# Patient Record
Sex: Male | Born: 1940
Health system: Southern US, Community
[De-identification: ages and names within clinical notes are randomized; demographics above are authoritative.]

## PROBLEM LIST (undated history)

## (undated) DIAGNOSIS — I10 Essential (primary) hypertension: Secondary | ICD-10-CM

## (undated) DIAGNOSIS — K219 Gastro-esophageal reflux disease without esophagitis: Secondary | ICD-10-CM

## (undated) DIAGNOSIS — D649 Anemia, unspecified: Secondary | ICD-10-CM

## (undated) DIAGNOSIS — T4145XA Adverse effect of unspecified anesthetic, initial encounter: Secondary | ICD-10-CM

## (undated) DIAGNOSIS — M1990 Unspecified osteoarthritis, unspecified site: Secondary | ICD-10-CM

## (undated) DIAGNOSIS — N189 Chronic kidney disease, unspecified: Secondary | ICD-10-CM

## (undated) DIAGNOSIS — D631 Anemia in chronic kidney disease: Principal | ICD-10-CM

## (undated) DIAGNOSIS — T8859XA Other complications of anesthesia, initial encounter: Secondary | ICD-10-CM

## (undated) HISTORY — PX: CARPAL TUNNEL RELEASE: SHX101

## (undated) HISTORY — DX: Chronic kidney disease, unspecified: N18.9

## (undated) HISTORY — DX: Essential (primary) hypertension: I10

## (undated) HISTORY — PX: OTHER SURGICAL HISTORY: SHX169

## (undated) HISTORY — DX: Anemia in chronic kidney disease: D63.1

## (undated) HISTORY — DX: Gastro-esophageal reflux disease without esophagitis: K21.9

---

## 1970-08-22 HISTORY — PX: BACK SURGERY: SHX140

## 1998-12-15 ENCOUNTER — Ambulatory Visit (HOSPITAL_COMMUNITY): Admission: RE | Admit: 1998-12-15 | Discharge: 1998-12-15 | Payer: Self-pay | Admitting: Family Medicine

## 1998-12-15 ENCOUNTER — Encounter: Payer: Self-pay | Admitting: Family Medicine

## 2000-03-02 ENCOUNTER — Other Ambulatory Visit: Admission: RE | Admit: 2000-03-02 | Discharge: 2000-03-02 | Payer: Self-pay | Admitting: Urology

## 2001-08-22 HISTORY — PX: BACK SURGERY: SHX140

## 2003-10-13 ENCOUNTER — Inpatient Hospital Stay (HOSPITAL_COMMUNITY): Admission: RE | Admit: 2003-10-13 | Discharge: 2003-10-14 | Payer: Self-pay | Admitting: Orthopedic Surgery

## 2004-12-22 IMAGING — CR DG LUMBAR SPINE 1V
1 series · 1 of 1 positions shown · non-contrast
Comparison: none

CLINICAL DATA: Patient with lumbar spinal stenosis.
 LUMBAR SPINE
 Cross-table laterals from the OR interpreted after surgery.
 FILM #1:
 Film #1 demonstrates surgical needles directed towards the posterior aspect of the spinous processes of L3, L4, and L5.
 FILM #2:
 Film #2 demonstrates a surgical probe directed between the spinous processes of L3 and L4.
 IMPRESSION
 L3-4 localization.

[view not recorded]
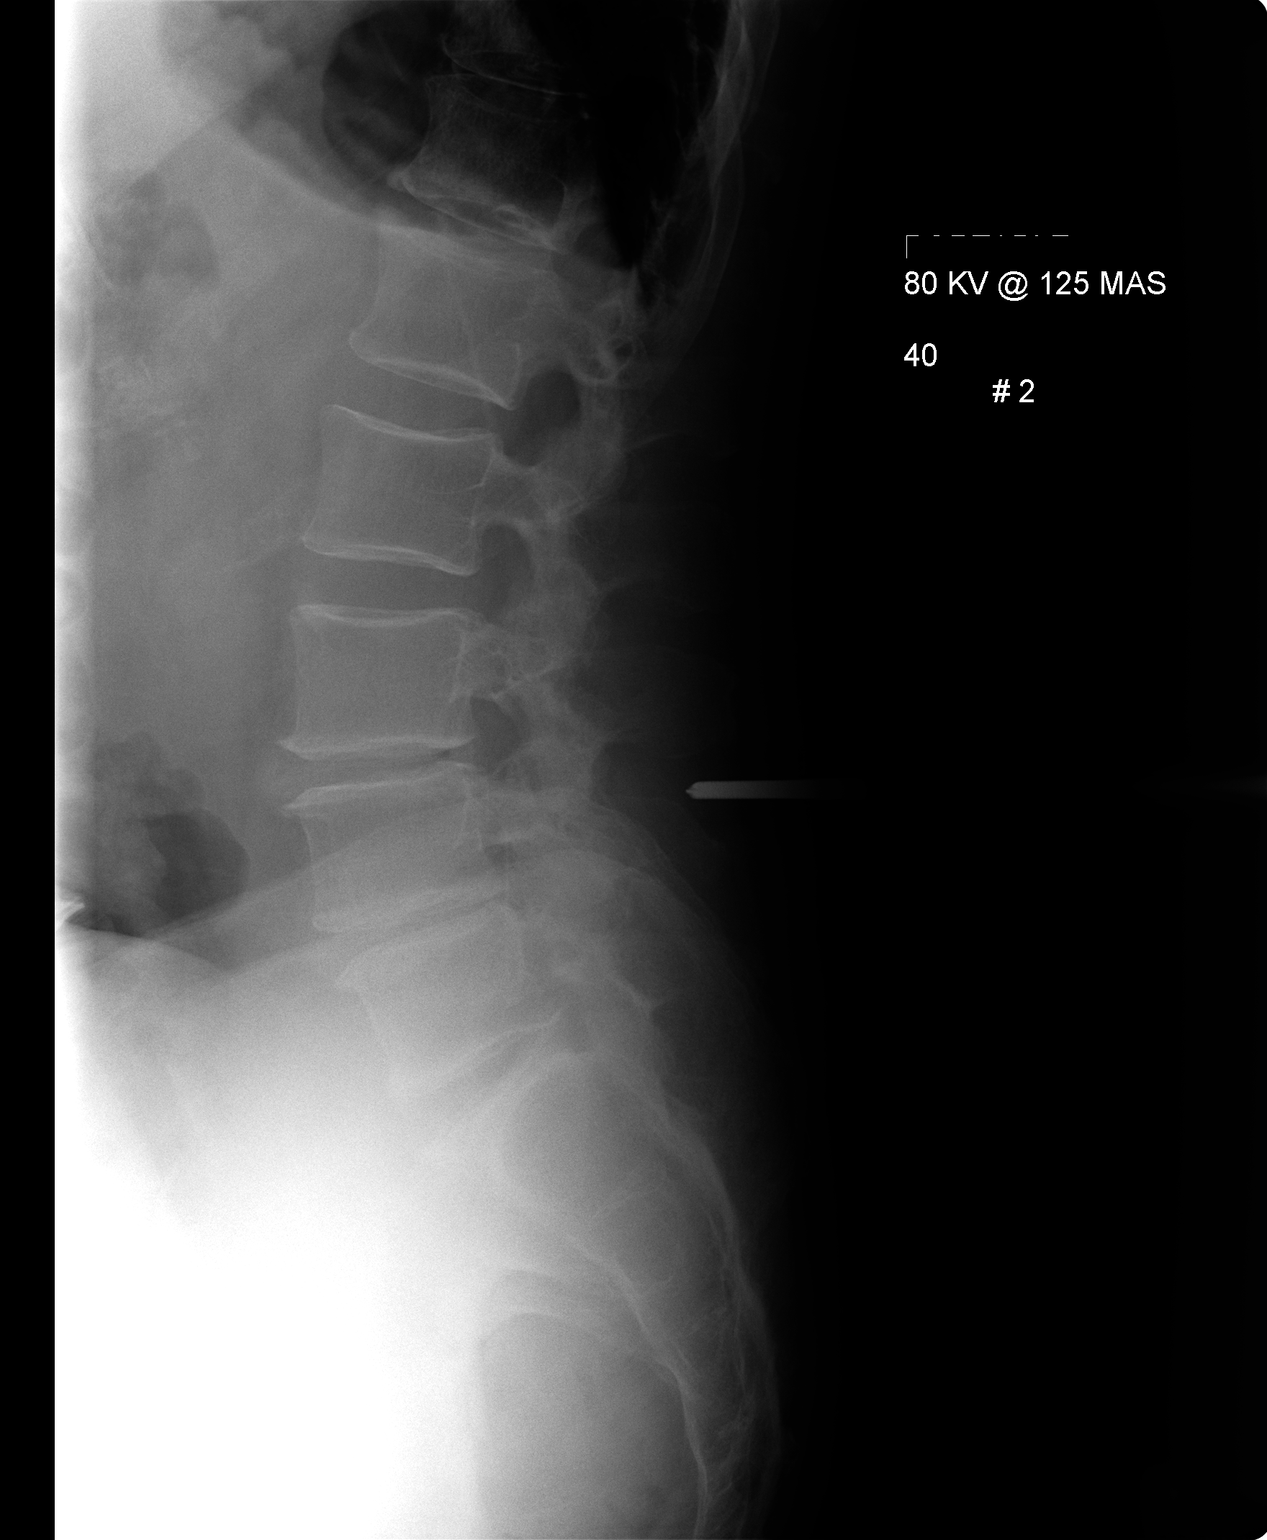

[1 of 1 positions shown; findings below may reference images not displayed]

## 2006-03-09 ENCOUNTER — Encounter: Admission: RE | Admit: 2006-03-09 | Discharge: 2006-03-09 | Payer: Self-pay | Admitting: Family Medicine

## 2006-06-16 ENCOUNTER — Ambulatory Visit (HOSPITAL_COMMUNITY): Admission: RE | Admit: 2006-06-16 | Discharge: 2006-06-16 | Payer: Self-pay | Admitting: Orthopedic Surgery

## 2006-07-11 ENCOUNTER — Ambulatory Visit (HOSPITAL_COMMUNITY): Admission: RE | Admit: 2006-07-11 | Discharge: 2006-07-11 | Payer: Self-pay | Admitting: Orthopedic Surgery

## 2009-12-02 ENCOUNTER — Encounter (INDEPENDENT_AMBULATORY_CARE_PROVIDER_SITE_OTHER): Payer: Self-pay | Admitting: *Deleted

## 2010-01-08 ENCOUNTER — Encounter (INDEPENDENT_AMBULATORY_CARE_PROVIDER_SITE_OTHER): Payer: Self-pay | Admitting: *Deleted

## 2010-09-21 NOTE — Letter (Signed)
Summary: Colonoscopy Letter  [facility]  [address] [place], [place] [address]   Phone: [phone]  Fax: [phone]      December 02, 2009 MRN: [id number]   Curtis Munoz [address] [place], [place]  [address]   Dear [name],   According to your medical record, it is time for you to schedule a Colonoscopy. The American Cancer Society recommends this procedure as a method to detect early colon cancer. Patients with a family history of colon cancer, or a personal history of colon polyps or inflammatory bowel disease are at increased risk.  This letter has beeen generated based on the recommendations made at the time of your procedure. If you feel that in your particular situation this may no longer apply, please contact our office.  Please call our office at [phone] to schedule this appointment or to update your records at your earliest convenience.  Thank you for cooperating with [place] to provide you with the very best care possible.   Sincerely,  Hedwig Morton. Juanda Chance, M.D.  [facility] Gastroenterology Division [phone]

## 2010-09-21 NOTE — Letter (Signed)
Summary: Colonoscopy Letter  [facility]  [address] [place], [place] [address]   Phone: [phone]  Fax: [phone]      Jan 08, 2010 MRN: [id number]   KAZIMIR HARTNETT [address] [place], [place]  [address]   Dear Mr. CREAR,   According to your medical record, it is time for you to schedule a Colonoscopy. The American Cancer Society recommends this procedure as a method to detect early colon cancer. Patients with a family history of colon cancer, or a personal history of colon polyps or inflammatory bowel disease are at increased risk.  This letter has beeen generated based on the recommendations made at the time of your procedure. If you feel that in your particular situation this may no longer apply, please contact our office.  Please call our office at [phone] to schedule this appointment or to update your records at your earliest convenience.  Thank you for cooperating with [place] to provide you with the very best care possible.   Sincerely,    Vania Rea. Jarold Motto, M.D.  [facility] Gastroenterology Division [phone]

## 2011-03-21 ENCOUNTER — Telehealth: Payer: Self-pay | Admitting: *Deleted

## 2011-03-21 NOTE — Telephone Encounter (Signed)
Pt states that he does not wish to schedule his colonoscopy until he meets with his Primary care MD. He will call back to schedule after his visit with her.

## 2011-11-11 DIAGNOSIS — E781 Pure hyperglyceridemia: Secondary | ICD-10-CM | POA: Diagnosis not present

## 2011-11-29 ENCOUNTER — Ambulatory Visit
Admission: RE | Admit: 2011-11-29 | Discharge: 2011-11-29 | Disposition: A | Discharge status: Home / Self Care | Payer: Medicare Other | Source: Ambulatory Visit | Attending: Family Medicine | Admitting: Family Medicine

## 2011-11-29 ENCOUNTER — Other Ambulatory Visit: Payer: Self-pay | Admitting: Family Medicine

## 2011-11-29 DIAGNOSIS — H60399 Other infective otitis externa, unspecified ear: Secondary | ICD-10-CM | POA: Diagnosis not present

## 2011-11-29 DIAGNOSIS — M779 Enthesopathy, unspecified: Secondary | ICD-10-CM

## 2011-11-29 DIAGNOSIS — M79609 Pain in unspecified limb: Secondary | ICD-10-CM | POA: Diagnosis not present

## 2011-11-29 DIAGNOSIS — M7730 Calcaneal spur, unspecified foot: Secondary | ICD-10-CM | POA: Diagnosis not present

## 2012-02-17 DIAGNOSIS — I10 Essential (primary) hypertension: Secondary | ICD-10-CM | POA: Diagnosis not present

## 2012-02-17 DIAGNOSIS — E109 Type 1 diabetes mellitus without complications: Secondary | ICD-10-CM | POA: Diagnosis not present

## 2012-02-17 DIAGNOSIS — E781 Pure hyperglyceridemia: Secondary | ICD-10-CM | POA: Diagnosis not present

## 2012-02-24 DIAGNOSIS — H60399 Other infective otitis externa, unspecified ear: Secondary | ICD-10-CM | POA: Diagnosis not present

## 2012-02-24 DIAGNOSIS — E1110 Type 2 diabetes mellitus with ketoacidosis without coma: Secondary | ICD-10-CM | POA: Diagnosis not present

## 2012-02-24 DIAGNOSIS — E7800 Pure hypercholesterolemia, unspecified: Secondary | ICD-10-CM | POA: Diagnosis not present

## 2012-02-24 DIAGNOSIS — I10 Essential (primary) hypertension: Secondary | ICD-10-CM | POA: Diagnosis not present

## 2012-03-19 DIAGNOSIS — H60399 Other infective otitis externa, unspecified ear: Secondary | ICD-10-CM | POA: Diagnosis not present

## 2012-07-24 DIAGNOSIS — E781 Pure hyperglyceridemia: Secondary | ICD-10-CM | POA: Diagnosis not present

## 2012-07-24 DIAGNOSIS — E7800 Pure hypercholesterolemia, unspecified: Secondary | ICD-10-CM | POA: Diagnosis not present

## 2012-07-24 DIAGNOSIS — Z23 Encounter for immunization: Secondary | ICD-10-CM | POA: Diagnosis not present

## 2012-07-24 DIAGNOSIS — E119 Type 2 diabetes mellitus without complications: Secondary | ICD-10-CM | POA: Diagnosis not present

## 2012-07-24 DIAGNOSIS — Z125 Encounter for screening for malignant neoplasm of prostate: Secondary | ICD-10-CM | POA: Diagnosis not present

## 2012-07-24 DIAGNOSIS — I10 Essential (primary) hypertension: Secondary | ICD-10-CM | POA: Diagnosis not present

## 2012-12-25 DIAGNOSIS — I10 Essential (primary) hypertension: Secondary | ICD-10-CM | POA: Diagnosis not present

## 2012-12-25 DIAGNOSIS — E7800 Pure hypercholesterolemia, unspecified: Secondary | ICD-10-CM | POA: Diagnosis not present

## 2012-12-25 DIAGNOSIS — E119 Type 2 diabetes mellitus without complications: Secondary | ICD-10-CM | POA: Diagnosis not present

## 2013-01-16 DIAGNOSIS — H60399 Other infective otitis externa, unspecified ear: Secondary | ICD-10-CM | POA: Diagnosis not present

## 2013-05-24 ENCOUNTER — Encounter: Payer: Self-pay | Admitting: Gastroenterology

## 2013-05-24 DIAGNOSIS — I10 Essential (primary) hypertension: Secondary | ICD-10-CM | POA: Diagnosis not present

## 2013-05-24 DIAGNOSIS — Z23 Encounter for immunization: Secondary | ICD-10-CM | POA: Diagnosis not present

## 2013-05-24 DIAGNOSIS — D649 Anemia, unspecified: Secondary | ICD-10-CM | POA: Diagnosis not present

## 2013-05-24 DIAGNOSIS — E119 Type 2 diabetes mellitus without complications: Secondary | ICD-10-CM | POA: Diagnosis not present

## 2013-05-24 DIAGNOSIS — E7800 Pure hypercholesterolemia, unspecified: Secondary | ICD-10-CM | POA: Diagnosis not present

## 2013-06-03 ENCOUNTER — Ambulatory Visit (AMBULATORY_SURGERY_CENTER): Payer: Self-pay

## 2013-06-03 VITALS — Ht 70.0 in | Wt 194.0 lb

## 2013-06-03 DIAGNOSIS — Z1211 Encounter for screening for malignant neoplasm of colon: Secondary | ICD-10-CM

## 2013-06-03 MED ORDER — MOVIPREP 100 G PO SOLR: 1.0000 | Freq: Once | ORAL | Status: DC

## 2013-06-13 DIAGNOSIS — H2589 Other age-related cataract: Secondary | ICD-10-CM | POA: Diagnosis not present

## 2013-06-17 ENCOUNTER — Encounter: Payer: Self-pay | Admitting: Gastroenterology

## 2013-06-17 ENCOUNTER — Ambulatory Visit (AMBULATORY_SURGERY_CENTER): Payer: Medicare Other | Admitting: Gastroenterology

## 2013-06-17 VITALS — BP 109/76 | HR 55 | Temp 96.8°F | Resp 13 | Ht 70.0 in | Wt 194.0 lb

## 2013-06-17 DIAGNOSIS — Z1211 Encounter for screening for malignant neoplasm of colon: Secondary | ICD-10-CM | POA: Diagnosis not present

## 2013-06-17 DIAGNOSIS — I10 Essential (primary) hypertension: Secondary | ICD-10-CM | POA: Diagnosis not present

## 2013-06-17 DIAGNOSIS — K5730 Diverticulosis of large intestine without perforation or abscess without bleeding: Secondary | ICD-10-CM

## 2013-06-17 MED ORDER — SODIUM CHLORIDE 0.9 % IV SOLN: 500.0000 mL | INTRAVENOUS | Status: DC

## 2013-06-17 NOTE — Progress Notes (Signed)
Patient did not experience any of the following events: a burn prior to discharge; a fall within the facility; wrong site/side/patient/procedure/implant event; or a hospital transfer or hospital admission upon discharge from the facility. (G8907) Patient did not have preoperative order for IV antibiotic SSI prophylaxis. (G8918)  

## 2013-06-17 NOTE — Patient Instructions (Signed)
Impressions/recommendations:  Diverticulosis (handout given) High Fiber diet (handout given)  Repeat colonoscopy in 10 years.  YOU HAD AN ENDOSCOPIC PROCEDURE TODAY AT THE [facility]: Refer to the procedure report that was given to you for any specific questions about what was found during the examination.  If the procedure report does not answer your questions, please call your gastroenterologist to clarify.  If you requested that your care partner not be given the details of your procedure findings, then the procedure report has been included in a sealed envelope for you to review at your convenience later.  YOU SHOULD EXPECT: Some feelings of bloating in the abdomen. Passage of more gas than usual.  Walking can help get rid of the air that was put into your GI tract during the procedure and reduce the bloating. If you had a lower endoscopy (such as a colonoscopy or flexible sigmoidoscopy) you may notice spotting of blood in your stool or on the toilet paper. If you underwent a bowel prep for your procedure, then you may not have a normal bowel movement for a few days.  DIET: Your first meal following the procedure should be a light meal and then it is ok to progress to your normal diet.  A half-sandwich or bowl of soup is an example of a good first meal.  Heavy or fried foods are harder to digest and may make you feel nauseous or bloated.  Likewise meals heavy in dairy and vegetables can cause extra gas to form and this can also increase the bloating.  Drink plenty of fluids but you should avoid alcoholic beverages for 24 hours.  ACTIVITY: Your care partner should take you home directly after the procedure.  You should plan to take it easy, moving slowly for the rest of the day.  You can resume normal activity the day after the procedure however you should NOT DRIVE or use heavy machinery for 24 hours (because of the sedation medicines used during the test).    SYMPTOMS TO REPORT  IMMEDIATELY: A gastroenterologist can be reached at any hour.  During normal business hours, 8:30 AM to 5:00 PM Monday through Friday, call [phone].  After hours and on weekends, please call the GI answering service at [phone] who will take a message and have the physician on call contact you.   Following lower endoscopy (colonoscopy or flexible sigmoidoscopy):  Excessive amounts of blood in the stool  Significant tenderness or worsening of abdominal pains  Swelling of the abdomen that is new, acute  Fever of 100F or higher  FOLLOW UP: If any biopsies were taken you will be contacted by phone or by letter within the next 1-3 weeks.  Call your gastroenterologist if you have not heard about the biopsies in 3 weeks.  Our staff will call the home number listed on your records the next business day following your procedure to check on you and address any questions or concerns that you may have at that time regarding the information given to you following your procedure. This is a courtesy call and so if there is no answer at the home number and we have not heard from you through the emergency physician on call, we will assume that you have returned to your regular daily activities without incident.  SIGNATURES/CONFIDENTIALITY: You and/or your care partner have signed paperwork which will be entered into your electronic medical record.  These signatures attest to the fact that that the information above on your   After Visit Summary has been reviewed and is understood.  Full responsibility of the confidentiality of this discharge information lies with you and/or your care-partner. 

## 2013-06-17 NOTE — Op Note (Signed)
[facility] [address] [place], [address]   COLONOSCOPY PROCEDURE REPORT  PATIENT: Curtis Munoz, Curtis Munoz  MR#: [id number] BIRTHDATE: Oct 24, 1940 , 72  yrs. old GENDER: Male ENDOSCOPIST: Mardella Layman, MD, [facility] REFERRED BY: PROCEDURE DATE:  06/17/2013 PROCEDURE:   Colonoscopy, screening First Screening Colonoscopy - Avg.  risk and is 50 yrs.  old or older - No.      History of Adenoma - Now for follow-up colonoscopy & has been > or = to 3 yrs.  N/A ASA CLASS:   Class II INDICATIONS:average risk screening. MEDICATIONS: propofol (Diprivan) 250mg  IV  DESCRIPTION OF PROCEDURE:   After the risks benefits and alternatives of the procedure were thoroughly explained, informed consent was obtained.  A digital rectal exam revealed no abnormalities of the rectum.   The LB [id number]  endoscope was introduced through the anus and advanced to the   . No adverse events experienced.   The quality of the prep was excellent, using MoviPrep  The instrument was then slowly withdrawn as the colon was fully examined.      COLON FINDINGS: There was moderate diverticulosis noted in the descending colon, sigmoid colon, and at the splenic flexure with associated colonic narrowing, muscular hypertrophy and colonic spasm.   The colon was otherwise normal.  There was no diverticulosis, inflammation, polyps or cancers unless previously stated.  Retroflexed views revealed no abnormalities. The time to cecum=10 minutes 43 seconds.  Withdrawal time=6 minutes 14 seconds. The scope was withdrawn and the procedure completed. COMPLICATIONS: There were no complications.  ENDOSCOPIC IMPRESSION: 1.   There was moderate diverticulosis noted in the descending colon, sigmoid colon, and at the splenic flexure 2.   The colon was otherwise normal  RECOMMENDATIONS: 1.  High fiber diet with liberal fluid intake. 2.  Continue current colorectal screening recommendations for "routine risk"  patients with a repeat colonoscopy in 10 years. 3.  Continue current medications   eSigned:  Mardella Layman, MD, [facility] 06/17/2013 9:11 AM   cc:

## 2013-06-18 ENCOUNTER — Telehealth: Payer: Self-pay | Admitting: *Deleted

## 2013-06-18 NOTE — Telephone Encounter (Signed)
  Follow up Call-  Call back number 06/17/2013  Post procedure Call Back phone  # [phone] hm  Permission to leave phone message Yes     Patient questions:  Do you have a fever, pain , or abdominal swelling? no Pain Score  0 *  Have you tolerated food without any problems? yes  Have you been able to return to your normal activities? yes  Do you have any questions about your discharge instructions: Diet   no Medications  no Follow up visit  no  Do you have questions or concerns about your Care? no  Actions: * If pain score is 4 or above: No action needed, pain <4.

## 2013-08-23 ENCOUNTER — Ambulatory Visit
Admission: RE | Admit: 2013-08-23 | Discharge: 2013-08-23 | Disposition: A | Discharge status: Home / Self Care | Payer: Medicare Other | Source: Ambulatory Visit | Attending: Family Medicine | Admitting: Family Medicine

## 2013-08-23 ENCOUNTER — Other Ambulatory Visit: Payer: Self-pay | Admitting: Family Medicine

## 2013-08-23 DIAGNOSIS — M7732 Calcaneal spur, left foot: Secondary | ICD-10-CM

## 2013-08-23 DIAGNOSIS — M7730 Calcaneal spur, unspecified foot: Secondary | ICD-10-CM | POA: Diagnosis not present

## 2013-08-23 DIAGNOSIS — I10 Essential (primary) hypertension: Secondary | ICD-10-CM | POA: Diagnosis not present

## 2013-08-23 DIAGNOSIS — E119 Type 2 diabetes mellitus without complications: Secondary | ICD-10-CM | POA: Diagnosis not present

## 2013-08-23 DIAGNOSIS — E7800 Pure hypercholesterolemia, unspecified: Secondary | ICD-10-CM | POA: Diagnosis not present

## 2013-09-11 ENCOUNTER — Encounter: Payer: Self-pay | Admitting: Podiatry

## 2013-09-11 ENCOUNTER — Ambulatory Visit (INDEPENDENT_AMBULATORY_CARE_PROVIDER_SITE_OTHER): Payer: Medicare Other | Admitting: Podiatry

## 2013-09-11 VITALS — BP 130/62 | HR 62 | Resp 16 | Ht 70.0 in | Wt 194.0 lb

## 2013-09-11 DIAGNOSIS — M722 Plantar fascial fibromatosis: Secondary | ICD-10-CM

## 2013-09-11 MED ORDER — TRIAMCINOLONE ACETONIDE 10 MG/ML IJ SUSP
10.0000 mg | Freq: Once | INTRAMUSCULAR | Status: AC
  Administered 2013-09-11: 10 mg

## 2013-09-11 NOTE — Patient Instructions (Signed)

## 2013-09-11 NOTE — Progress Notes (Signed)
Subjective:     Patient ID: Curtis Munoz, male   DOB: [birth year], 73 y.o.   MRN: [id number]  Foot Pain   patient states that he is heel pain on his left heel of several months duration and it has been gradually getting worse over that period of time. Has tried to reduce activities and wear over-the-counter arch supports without relief at this time   Review of Systems  All other systems reviewed and are negative.       Objective:   Physical Exam  Nursing note and vitals reviewed. Cardiovascular: Intact distal pulses.   Musculoskeletal: Normal range of motion.  Neurological: He is alert.  Skin: Skin is warm.   neurovascular status intact with no health history changes noted and patient has mild equinus condition with normal muscle strength noted normal hair growth and temperature turgor texture. Patient's left plantar heel is very tender at the insertional point of the fascia to the calcaneus     Assessment:     Plantar fasciitis left inflammation and fluid around the medial band    Plan:     H&P and x-ray reviewed. Injected the left plantar fascia 3 mg Kenalog 5 mg Xylocaine Marcaine mixture a. Reappoint to recheck in one weeknd applied fascially brace to reduce pressure

## 2013-09-11 NOTE — Progress Notes (Signed)
   Subjective:    Patient ID: Curtis Munoz, male    DOB: 04-13-1941, 73 y.o.   MRN: [id number]  HPI Comments: "I have a heel that is hurting"  Patient states that his plantar heel left foot has been achy for a few months. He has AM pain. His PCP xrayed and said that he did have heel spurs. No treatment given. Pt usually walks with a walking group 2 x wkly x 1hr and he has decreased that with no relief.   Foot Pain      Review of Systems  Musculoskeletal: Positive for gait problem.  All other systems reviewed and are negative.       Objective:   Physical Exam        Assessment & Plan:

## 2013-09-16 ENCOUNTER — Encounter: Payer: Self-pay | Admitting: Podiatry

## 2013-09-16 ENCOUNTER — Ambulatory Visit (INDEPENDENT_AMBULATORY_CARE_PROVIDER_SITE_OTHER): Payer: Medicare Other | Admitting: Podiatry

## 2013-09-16 VITALS — BP 109/51 | HR 70 | Resp 16

## 2013-09-16 DIAGNOSIS — M722 Plantar fascial fibromatosis: Secondary | ICD-10-CM | POA: Diagnosis not present

## 2013-09-16 NOTE — Progress Notes (Signed)
Subjective:     Patient ID: Curtis Munoz, male   DOB: 04-26-1941, 73 y.o.   MRN: [id number]  HPI patient states I'm doing a lot better with my left heel and I'm able to walk without discomfort   Review of Systems     Objective:   Physical Exam Neurovascular status intact with no health history changes noted and significant reduction of discomfort left plantar heel at the insertion of the tendon into the calcaneus    Assessment:     Improved plantar fasciitis left with treatment    Plan:     Advised on physical therapy anti-inflammatories and supportive shoe gear reappoint as needed

## 2013-12-24 DIAGNOSIS — E7800 Pure hypercholesterolemia, unspecified: Secondary | ICD-10-CM | POA: Diagnosis not present

## 2013-12-24 DIAGNOSIS — Z125 Encounter for screening for malignant neoplasm of prostate: Secondary | ICD-10-CM | POA: Diagnosis not present

## 2013-12-24 DIAGNOSIS — I10 Essential (primary) hypertension: Secondary | ICD-10-CM | POA: Diagnosis not present

## 2013-12-24 DIAGNOSIS — E119 Type 2 diabetes mellitus without complications: Secondary | ICD-10-CM | POA: Diagnosis not present

## 2014-05-06 DIAGNOSIS — E119 Type 2 diabetes mellitus without complications: Secondary | ICD-10-CM | POA: Diagnosis not present

## 2014-05-06 DIAGNOSIS — R7309 Other abnormal glucose: Secondary | ICD-10-CM | POA: Diagnosis not present

## 2014-05-06 DIAGNOSIS — E669 Obesity, unspecified: Secondary | ICD-10-CM | POA: Diagnosis not present

## 2014-05-06 DIAGNOSIS — I10 Essential (primary) hypertension: Secondary | ICD-10-CM | POA: Diagnosis not present

## 2014-07-31 DIAGNOSIS — E1169 Type 2 diabetes mellitus with other specified complication: Secondary | ICD-10-CM | POA: Diagnosis not present

## 2014-07-31 DIAGNOSIS — E785 Hyperlipidemia, unspecified: Secondary | ICD-10-CM | POA: Diagnosis not present

## 2014-09-03 DIAGNOSIS — I10 Essential (primary) hypertension: Secondary | ICD-10-CM | POA: Diagnosis not present

## 2014-09-03 DIAGNOSIS — E782 Mixed hyperlipidemia: Secondary | ICD-10-CM | POA: Diagnosis not present

## 2014-09-03 DIAGNOSIS — Z23 Encounter for immunization: Secondary | ICD-10-CM | POA: Diagnosis not present

## 2015-01-01 DIAGNOSIS — E785 Hyperlipidemia, unspecified: Secondary | ICD-10-CM | POA: Diagnosis not present

## 2015-01-01 DIAGNOSIS — R7309 Other abnormal glucose: Secondary | ICD-10-CM | POA: Diagnosis not present

## 2015-01-09 DIAGNOSIS — L989 Disorder of the skin and subcutaneous tissue, unspecified: Secondary | ICD-10-CM | POA: Diagnosis not present

## 2015-01-09 DIAGNOSIS — E119 Type 2 diabetes mellitus without complications: Secondary | ICD-10-CM | POA: Diagnosis not present

## 2015-04-01 DIAGNOSIS — L821 Other seborrheic keratosis: Secondary | ICD-10-CM | POA: Diagnosis not present

## 2015-04-01 DIAGNOSIS — D485 Neoplasm of uncertain behavior of skin: Secondary | ICD-10-CM | POA: Diagnosis not present

## 2015-04-01 DIAGNOSIS — C44310 Basal cell carcinoma of skin of unspecified parts of face: Secondary | ICD-10-CM | POA: Diagnosis not present

## 2015-04-01 DIAGNOSIS — D1801 Hemangioma of skin and subcutaneous tissue: Secondary | ICD-10-CM | POA: Diagnosis not present

## 2015-04-01 DIAGNOSIS — C44319 Basal cell carcinoma of skin of other parts of face: Secondary | ICD-10-CM | POA: Diagnosis not present

## 2015-04-01 DIAGNOSIS — D225 Melanocytic nevi of trunk: Secondary | ICD-10-CM | POA: Diagnosis not present

## 2015-04-01 DIAGNOSIS — D224 Melanocytic nevi of scalp and neck: Secondary | ICD-10-CM | POA: Diagnosis not present

## 2015-04-14 DIAGNOSIS — D235 Other benign neoplasm of skin of trunk: Secondary | ICD-10-CM | POA: Diagnosis not present

## 2015-04-14 DIAGNOSIS — D485 Neoplasm of uncertain behavior of skin: Secondary | ICD-10-CM | POA: Diagnosis not present

## 2015-05-06 DIAGNOSIS — C44319 Basal cell carcinoma of skin of other parts of face: Secondary | ICD-10-CM | POA: Diagnosis not present

## 2015-05-06 DIAGNOSIS — Z85828 Personal history of other malignant neoplasm of skin: Secondary | ICD-10-CM | POA: Diagnosis not present

## 2015-05-12 DIAGNOSIS — I10 Essential (primary) hypertension: Secondary | ICD-10-CM | POA: Diagnosis not present

## 2015-05-12 DIAGNOSIS — L989 Disorder of the skin and subcutaneous tissue, unspecified: Secondary | ICD-10-CM | POA: Diagnosis not present

## 2015-05-12 DIAGNOSIS — E119 Type 2 diabetes mellitus without complications: Secondary | ICD-10-CM | POA: Diagnosis not present

## 2015-05-19 DIAGNOSIS — Z85828 Personal history of other malignant neoplasm of skin: Secondary | ICD-10-CM | POA: Diagnosis not present

## 2015-05-19 DIAGNOSIS — C44319 Basal cell carcinoma of skin of other parts of face: Secondary | ICD-10-CM | POA: Diagnosis not present

## 2015-05-26 DIAGNOSIS — Z4802 Encounter for removal of sutures: Secondary | ICD-10-CM | POA: Diagnosis not present

## 2015-06-10 DIAGNOSIS — H524 Presbyopia: Secondary | ICD-10-CM | POA: Diagnosis not present

## 2015-06-10 DIAGNOSIS — H5203 Hypermetropia, bilateral: Secondary | ICD-10-CM | POA: Diagnosis not present

## 2015-06-10 DIAGNOSIS — H2513 Age-related nuclear cataract, bilateral: Secondary | ICD-10-CM | POA: Diagnosis not present

## 2015-06-10 DIAGNOSIS — H52223 Regular astigmatism, bilateral: Secondary | ICD-10-CM | POA: Diagnosis not present

## 2015-08-04 DIAGNOSIS — H2511 Age-related nuclear cataract, right eye: Secondary | ICD-10-CM | POA: Diagnosis not present

## 2015-08-04 DIAGNOSIS — H02839 Dermatochalasis of unspecified eye, unspecified eyelid: Secondary | ICD-10-CM | POA: Diagnosis not present

## 2015-08-04 DIAGNOSIS — H18412 Arcus senilis, left eye: Secondary | ICD-10-CM | POA: Diagnosis not present

## 2015-08-04 DIAGNOSIS — H18411 Arcus senilis, right eye: Secondary | ICD-10-CM | POA: Diagnosis not present

## 2015-08-04 DIAGNOSIS — H2512 Age-related nuclear cataract, left eye: Secondary | ICD-10-CM | POA: Diagnosis not present

## 2015-08-31 DIAGNOSIS — H2512 Age-related nuclear cataract, left eye: Secondary | ICD-10-CM | POA: Diagnosis not present

## 2015-08-31 DIAGNOSIS — H25812 Combined forms of age-related cataract, left eye: Secondary | ICD-10-CM | POA: Diagnosis not present

## 2015-08-31 DIAGNOSIS — H25012 Cortical age-related cataract, left eye: Secondary | ICD-10-CM | POA: Diagnosis not present

## 2015-09-01 DIAGNOSIS — H2511 Age-related nuclear cataract, right eye: Secondary | ICD-10-CM | POA: Diagnosis not present

## 2015-09-09 DIAGNOSIS — L989 Disorder of the skin and subcutaneous tissue, unspecified: Secondary | ICD-10-CM | POA: Diagnosis not present

## 2015-09-09 DIAGNOSIS — E118 Type 2 diabetes mellitus with unspecified complications: Secondary | ICD-10-CM | POA: Diagnosis not present

## 2015-09-09 DIAGNOSIS — E782 Mixed hyperlipidemia: Secondary | ICD-10-CM | POA: Diagnosis not present

## 2015-09-09 DIAGNOSIS — I10 Essential (primary) hypertension: Secondary | ICD-10-CM | POA: Diagnosis not present

## 2015-09-11 DIAGNOSIS — H2511 Age-related nuclear cataract, right eye: Secondary | ICD-10-CM | POA: Diagnosis not present

## 2015-09-11 DIAGNOSIS — H2513 Age-related nuclear cataract, bilateral: Secondary | ICD-10-CM | POA: Diagnosis not present

## 2015-09-11 DIAGNOSIS — H25811 Combined forms of age-related cataract, right eye: Secondary | ICD-10-CM | POA: Diagnosis not present

## 2015-11-25 DIAGNOSIS — L570 Actinic keratosis: Secondary | ICD-10-CM | POA: Diagnosis not present

## 2015-11-25 DIAGNOSIS — D225 Melanocytic nevi of trunk: Secondary | ICD-10-CM | POA: Diagnosis not present

## 2015-11-25 DIAGNOSIS — L821 Other seborrheic keratosis: Secondary | ICD-10-CM | POA: Diagnosis not present

## 2015-11-25 DIAGNOSIS — L814 Other melanin hyperpigmentation: Secondary | ICD-10-CM | POA: Diagnosis not present

## 2015-11-25 DIAGNOSIS — Z85828 Personal history of other malignant neoplasm of skin: Secondary | ICD-10-CM | POA: Diagnosis not present

## 2016-01-13 DIAGNOSIS — M25561 Pain in right knee: Secondary | ICD-10-CM | POA: Diagnosis not present

## 2016-01-27 DIAGNOSIS — M25561 Pain in right knee: Secondary | ICD-10-CM | POA: Diagnosis not present

## 2016-02-09 DIAGNOSIS — Z79899 Other long term (current) drug therapy: Secondary | ICD-10-CM | POA: Diagnosis not present

## 2016-02-09 DIAGNOSIS — E782 Mixed hyperlipidemia: Secondary | ICD-10-CM | POA: Diagnosis not present

## 2016-02-09 DIAGNOSIS — M25561 Pain in right knee: Secondary | ICD-10-CM | POA: Diagnosis not present

## 2016-02-09 DIAGNOSIS — I10 Essential (primary) hypertension: Secondary | ICD-10-CM | POA: Diagnosis not present

## 2016-02-09 DIAGNOSIS — R7309 Other abnormal glucose: Secondary | ICD-10-CM | POA: Diagnosis not present

## 2016-02-09 DIAGNOSIS — D649 Anemia, unspecified: Secondary | ICD-10-CM | POA: Diagnosis not present

## 2016-04-15 DIAGNOSIS — M25561 Pain in right knee: Secondary | ICD-10-CM | POA: Diagnosis not present

## 2016-06-27 DIAGNOSIS — H52223 Regular astigmatism, bilateral: Secondary | ICD-10-CM | POA: Diagnosis not present

## 2016-06-27 DIAGNOSIS — H5203 Hypermetropia, bilateral: Secondary | ICD-10-CM | POA: Diagnosis not present

## 2016-06-27 DIAGNOSIS — H2513 Age-related nuclear cataract, bilateral: Secondary | ICD-10-CM | POA: Diagnosis not present

## 2016-09-21 DIAGNOSIS — G5602 Carpal tunnel syndrome, left upper limb: Secondary | ICD-10-CM | POA: Diagnosis not present

## 2016-09-22 HISTORY — PX: OTHER SURGICAL HISTORY: SHX169

## 2016-10-03 DIAGNOSIS — G5602 Carpal tunnel syndrome, left upper limb: Secondary | ICD-10-CM | POA: Diagnosis not present

## 2016-10-03 DIAGNOSIS — M25561 Pain in right knee: Secondary | ICD-10-CM | POA: Diagnosis not present

## 2016-10-06 DIAGNOSIS — G5602 Carpal tunnel syndrome, left upper limb: Secondary | ICD-10-CM | POA: Diagnosis not present

## 2016-10-19 DIAGNOSIS — M25561 Pain in right knee: Secondary | ICD-10-CM | POA: Diagnosis not present

## 2016-10-20 DIAGNOSIS — I10 Essential (primary) hypertension: Secondary | ICD-10-CM | POA: Diagnosis not present

## 2016-10-20 DIAGNOSIS — M130 Polyarthritis, unspecified: Secondary | ICD-10-CM | POA: Diagnosis not present

## 2016-10-20 DIAGNOSIS — Z0189 Encounter for other specified special examinations: Secondary | ICD-10-CM | POA: Diagnosis not present

## 2016-10-20 DIAGNOSIS — E782 Mixed hyperlipidemia: Secondary | ICD-10-CM | POA: Diagnosis not present

## 2016-10-20 DIAGNOSIS — E118 Type 2 diabetes mellitus with unspecified complications: Secondary | ICD-10-CM | POA: Diagnosis not present

## 2016-11-01 ENCOUNTER — Ambulatory Visit (INDEPENDENT_AMBULATORY_CARE_PROVIDER_SITE_OTHER): Payer: Medicare Other | Admitting: Gastroenterology

## 2016-11-01 ENCOUNTER — Other Ambulatory Visit (INDEPENDENT_AMBULATORY_CARE_PROVIDER_SITE_OTHER): Payer: Medicare Other

## 2016-11-01 ENCOUNTER — Encounter: Payer: Self-pay | Admitting: Gastroenterology

## 2016-11-01 ENCOUNTER — Encounter (INDEPENDENT_AMBULATORY_CARE_PROVIDER_SITE_OTHER): Payer: Self-pay

## 2016-11-01 VITALS — BP 110/60 | HR 104 | Ht 70.0 in | Wt 198.1 lb

## 2016-11-01 DIAGNOSIS — D649 Anemia, unspecified: Secondary | ICD-10-CM

## 2016-11-01 LAB — FERRITIN: Ferritin: 108.7 ng/mL (ref 22.0–322.0)

## 2016-11-01 LAB — IRON AND TIBC
%SAT: 8 % — ABNORMAL LOW (ref 15–60)
IRON: 23 ug/dL — AB (ref 50–180)
TIBC: 282 ug/dL (ref 250–425)
UIBC: 259 ug/dL (ref 125–400)

## 2016-11-01 LAB — IGA: IGA: 221 mg/dL (ref 68–378)

## 2016-11-01 LAB — FOLATE: Folate: 19.3 ng/mL (ref >5.9)

## 2016-11-01 LAB — VITAMIN B12: VITAMIN B 12: 317 pg/mL (ref 211–911)

## 2016-11-01 NOTE — Progress Notes (Signed)
11/01/2016 Curtis Munoz [id number] 02/20/1941   HISTORY OF PRESENT ILLNESS:  This is a 76 year old male who is previously known to Dr. Sharlett Iles. His last colonoscopy was in October 2014 at which time he was found to have only moderate diverticulosis in the descending colon, sigmoid colon, and splenic flexure. It was otherwise normal and repeat was recommended in 10 years. He presents to our office today at the request of his PCP, Dr. Criss Rosales, for evaluation of anemia. He says that he is going to have his knee replaced with Dr. Percell Miller on April 5 and his PCP would not sign off on his medical clearance due to a low hemoglobin of 10.3 g. His MCV is normal 84.7. We do not have any iron studies, other labs for comparison, or Hemoccults. The patient tells me he has had issues with low hemoglobin and/or iron for several years. He says that when he tries to give blood he has to take iron tablets for several weeks leading up to the blood draw in order to qualify for giving blood. He denies seeing blood in his stools and he denies black stools. He absolutely has no GI complaints. He feels well and has no complaints of increased fatigue or weakness.  He tells me that his last labs prior to the most recent were probably about 1 year ago and he thinks that his hemoglobin was around 12 g at that time.  Past Medical History:  Diagnosis Date  . GERD (gastroesophageal reflux disease)   . Hypertension    Past Surgical History:  Procedure Laterality Date  . BACK SURGERY     released pinched nerve  . BACK SURGERY     40 yrs ago slipped disk  . CARPAL TUNNEL RELEASE     right  . carpal tunner release  09/2016   left  . rotator cuff surgery     right    reports that he has never smoked. He has never used smokeless tobacco. He reports that he drinks alcohol. He reports that he does not use drugs. family history includes Anemia in his mother; Obesity in his brother; Other in his father. No Known  Allergies    Outpatient Encounter Prescriptions as of 11/01/2016  Medication Sig  . aspirin 81 MG tablet Take 81 mg by mouth daily.  . benazepril (LOTENSIN) 20 MG tablet Take 20 mg by mouth daily.  . Iron TABS Take 25 mg by mouth.  Marland Kitchen omeprazole (PRILOSEC) 40 MG capsule Take 40 mg by mouth daily.   No facility-administered encounter medications on file as of 11/01/2016.      REVIEW OF SYSTEMS  : All other systems reviewed and negative except where noted in the History of Present Illness.   PHYSICAL EXAM: BP 110/60   Pulse (!) 104   Ht 5\' 10"  (1.778 m)   Wt 198 lb 2 oz (89.9 kg)   BMI 28.43 kg/m  General: Well developed white male in no acute distress Head: Normocephalic and atraumatic Eyes:  Sclerae anicteric, conjunctiva pink. Ears: Normal auditory acuity Lungs: Clear throughout to auscultation Heart: Regular rate and rhythm Abdomen: Soft, non-distended.  Normal bowel sounds.  Non-tender. Musculoskeletal: Symmetrical with no gross deformities  Skin: No lesions on visible extremities Extremities: No edema  Neurological: Alert oriented x 4, grossly non-focal Psychological:  Alert and cooperative. Normal mood and affect  ASSESSMENT AND PLAN: -Anemia:  Normocytic with Hgb 10.3 grams.  Patient tells me that he's had issues with anemia  on and off for several years. There is no overt sign of GI bleeding and no other GI complaints. We will check iron levels along with vitamin B-12 and folate levels. We'll also check celiac labs. I'll have him perform Hemoccult cards 3. We are going to obtain other previous labs from his PCP for comparison.   CC:  Lucianne Lei, MD

## 2016-11-01 NOTE — Patient Instructions (Addendum)
Your physician has requested that you go to the basement for lab work before leaving today.  Follow the instructions on the Hemoccult cards and mail them back to [place] when you are finished or you may take them directly to the lab in the basement of the [place] building. We will call you with the results.

## 2016-11-02 ENCOUNTER — Telehealth: Payer: Self-pay | Admitting: Gastroenterology

## 2016-11-02 LAB — TISSUE TRANSGLUTAMINASE, IGA: TISSUE TRANSGLUTAMINASE AB, IGA: 1 U/mL (ref <4)

## 2016-11-02 NOTE — Telephone Encounter (Signed)
The pt states he has been taking ibuprofen for knee pain.  He states he stopped about 2 days ago and wants to know if he can do his stool cards now.  He is having knee surgery and really needs to take the ibuprofen for the pain. I advised him to go ahead and do the stool cards and I will document he has been taking the ibuprofen.  Pt agreed.

## 2016-11-02 NOTE — Progress Notes (Signed)
I agree with the above note, plan 

## 2016-11-07 ENCOUNTER — Other Ambulatory Visit (INDEPENDENT_AMBULATORY_CARE_PROVIDER_SITE_OTHER): Payer: Medicare Other

## 2016-11-07 DIAGNOSIS — D649 Anemia, unspecified: Secondary | ICD-10-CM

## 2016-11-07 LAB — HEMOCCULT SLIDES (X 3 CARDS)
FECAL OCCULT BLD: NEGATIVE
OCCULT 1: NEGATIVE
OCCULT 2: NEGATIVE
OCCULT 3: NEGATIVE
OCCULT 4: NEGATIVE
OCCULT 5: NEGATIVE

## 2016-11-09 ENCOUNTER — Telehealth: Payer: Self-pay | Admitting: Gastroenterology

## 2016-11-09 NOTE — Telephone Encounter (Signed)
Note faxed to [place] at [phone].  She asked that I aslo fax this to Dr. Criss Rosales

## 2016-11-09 NOTE — Telephone Encounter (Signed)
He has mild anemia.  FOBT negative.    From STRICTLY A GI PERSPECTIVE, he is ok for orthopedic surgery.  I cannot "clear" him from cardiac, pulmonary perspective.

## 2016-11-09 NOTE — Telephone Encounter (Signed)
I left a message for [place] with the details.  I asked that she call back with her fax number and I will send her a copy of this note.

## 2016-11-09 NOTE — Telephone Encounter (Signed)
Dr. Ardis Hughs and Keane Scrape,  Orthopedics is requesting surgical clearance for knee surgery.  Please advise

## 2016-11-14 ENCOUNTER — Encounter (HOSPITAL_BASED_OUTPATIENT_CLINIC_OR_DEPARTMENT_OTHER): Payer: Self-pay

## 2016-11-14 ENCOUNTER — Ambulatory Visit (HOSPITAL_BASED_OUTPATIENT_CLINIC_OR_DEPARTMENT_OTHER): Admit: 2016-11-14 | Payer: Medicare Other | Admitting: Orthopedic Surgery

## 2016-11-14 SURGERY — ARTHROPLASTY, KNEE, UNICOMPARTMENTAL
Anesthesia: Spinal | Laterality: Left

## 2016-11-28 DIAGNOSIS — L2089 Other atopic dermatitis: Secondary | ICD-10-CM | POA: Diagnosis not present

## 2016-11-28 DIAGNOSIS — D1801 Hemangioma of skin and subcutaneous tissue: Secondary | ICD-10-CM | POA: Diagnosis not present

## 2016-11-28 DIAGNOSIS — D225 Melanocytic nevi of trunk: Secondary | ICD-10-CM | POA: Diagnosis not present

## 2016-11-28 DIAGNOSIS — B078 Other viral warts: Secondary | ICD-10-CM | POA: Diagnosis not present

## 2016-11-28 DIAGNOSIS — D485 Neoplasm of uncertain behavior of skin: Secondary | ICD-10-CM | POA: Diagnosis not present

## 2016-11-28 DIAGNOSIS — G5602 Carpal tunnel syndrome, left upper limb: Secondary | ICD-10-CM | POA: Diagnosis not present

## 2016-11-28 DIAGNOSIS — L821 Other seborrheic keratosis: Secondary | ICD-10-CM | POA: Diagnosis not present

## 2016-11-28 DIAGNOSIS — Z85828 Personal history of other malignant neoplasm of skin: Secondary | ICD-10-CM | POA: Diagnosis not present

## 2016-11-28 DIAGNOSIS — D224 Melanocytic nevi of scalp and neck: Secondary | ICD-10-CM | POA: Diagnosis not present

## 2016-11-28 DIAGNOSIS — M1712 Unilateral primary osteoarthritis, left knee: Secondary | ICD-10-CM | POA: Diagnosis not present

## 2016-12-02 DIAGNOSIS — M6281 Muscle weakness (generalized): Secondary | ICD-10-CM | POA: Diagnosis not present

## 2016-12-02 DIAGNOSIS — M25631 Stiffness of right wrist, not elsewhere classified: Secondary | ICD-10-CM | POA: Diagnosis not present

## 2016-12-02 DIAGNOSIS — M25632 Stiffness of left wrist, not elsewhere classified: Secondary | ICD-10-CM | POA: Diagnosis not present

## 2016-12-02 DIAGNOSIS — G5603 Carpal tunnel syndrome, bilateral upper limbs: Secondary | ICD-10-CM | POA: Diagnosis not present

## 2016-12-05 DIAGNOSIS — M1712 Unilateral primary osteoarthritis, left knee: Secondary | ICD-10-CM | POA: Diagnosis not present

## 2016-12-05 DIAGNOSIS — I10 Essential (primary) hypertension: Secondary | ICD-10-CM | POA: Diagnosis present

## 2016-12-05 DIAGNOSIS — M6281 Muscle weakness (generalized): Secondary | ICD-10-CM | POA: Diagnosis not present

## 2016-12-05 DIAGNOSIS — K219 Gastro-esophageal reflux disease without esophagitis: Secondary | ICD-10-CM | POA: Diagnosis present

## 2016-12-05 DIAGNOSIS — M25632 Stiffness of left wrist, not elsewhere classified: Secondary | ICD-10-CM | POA: Diagnosis not present

## 2016-12-05 DIAGNOSIS — G5603 Carpal tunnel syndrome, bilateral upper limbs: Secondary | ICD-10-CM | POA: Diagnosis not present

## 2016-12-05 DIAGNOSIS — M25631 Stiffness of right wrist, not elsewhere classified: Secondary | ICD-10-CM | POA: Diagnosis not present

## 2016-12-05 NOTE — H&P (Signed)
PREOPERATIVE H&P Patient ID: Curtis Munoz MRN: [id number] DOB/AGE: 1941/08/15 76 y.o.  Chief Complaint: UNILATERAL PRIMARY OSTEOARTHRITIS OF LEFT KNEE  Planned Procedure Date: 12/23/16 Medical and Cardiac Clearance by Dr. Criss Rosales  Additional clearance by GI: Dr. Ardis Hughs  HPI: Curtis Munoz is a 76 y.o. male with a history of HTN, GERD, and anemia who presents for evaluation of UNILATERAL PRIMARY OSTEOARTHRITIS OF LEFT KNEE. The patient has a history of pain and functional disability in the left knee due to arthritis and has failed non-surgical conservative treatments for greater than 12 weeks to include NSAID's and/or analgesics, corticosteriod injections and activity modification.  Onset of symptoms was gradual, starting 1 years ago with gradually worsening course since that time.  Patient currently rates pain at 6 out of 10 with activity. Patient has night pain, worsening of pain with activity and weight bearing and pain that interferes with activities of daily living.  Xrays of the left knee shows severe anteromedial osteoarthritis, stress x-ray shows good opening of the medial side and maintenance of space on the lateral side. There is no active infection.  Past Medical History:  Diagnosis Date  . GERD (gastroesophageal reflux disease)   . Hypertension    Past Surgical History:  Procedure Laterality Date  . BACK SURGERY     released pinched nerve  . BACK SURGERY     40 yrs ago slipped disk  . CARPAL TUNNEL RELEASE     right  . carpal tunner release  09/2016   left  . rotator cuff surgery     right   No Known Allergies Prior to Admission medications   Medication Sig Start Date End Date Taking? Authorizing Provider  aspirin 81 MG tablet Take 81 mg by mouth daily.    Historical Provider, MD  benazepril (LOTENSIN) 20 MG tablet Take 20 mg by mouth daily.    Historical Provider, MD  Iron TABS Take 25 mg by mouth.    Historical Provider, MD  omeprazole (PRILOSEC) 40 MG capsule Take 40  mg by mouth daily.    Historical Provider, MD   Social History   Social History  . Marital status: Married    Spouse name: N/A  . Number of children: 3  . Years of education: N/A   Occupational History  . retired    Social History Main Topics  . Smoking status: Never Smoker  . Smokeless tobacco: Never Used  . Alcohol use Yes     Comment: rare  . Drug use: No  . Sexual activity: Not on file   Other Topics Concern  . Not on file   Social History Narrative  . No narrative on file   Family History  Problem Relation Age of Onset  . Anemia Mother     age 64  . Other Father     pt states as far as he knows father didnt have medical problems  . Obesity Brother   . Colon cancer Neg Hx   . Pancreatic cancer Neg Hx   . Stomach cancer Neg Hx   . Esophageal cancer Neg Hx   . Rectal cancer Neg Hx     ROS: Currently denies lightheadedness, dizziness, Fever, chills, CP, SOB.   No personal history of DVT, PE, MI, or CVA. No loose teeth or dentures. All other systems have been reviewed and were otherwise currently negative with the exception of those mentioned in the HPI and as above.  Objective: Vitals: Ht: 5'8 and 1/2" Wt: 194  Temp: 97.8 BP: 120/66 Pulse: 74 O2 98% on room air. Physical Exam: General: Alert, NAD.  Antalgic Gait  HEENT: EOMI, Good Neck Extension  Pulm: No increased work of breathing.  Clear B/L A/P w/o crackle or wheeze.  CV: RRR, No m/g/r appreciated  GI: soft, NT, ND Neuro: Neuro without gross focal deficit.  Sensation intact distally Skin: No lesions in the area of chief complaint MSK/Surgical Site: left knee w/o redness or effusion.  medial JLT. ROM 0-100.  5/5 strength in extension and flexion.  +EHL/FHL.  NVI.  Stable Lachman's and varus and valgus stress.   Imaging Review Plain radiographs demonstrate severe anteromedial osteoarthritis, stress x-ray shows good opening of the medial side and maintenance of space on the lateral  side.  Assessment: UNILATERAL PRIMARY OSTEOARTHRITIS OF LEFT KNEE Principal Problem:   Unilateral primary osteoarthritis, left knee Active Problems:   Normocytic anemia   GERD (gastroesophageal reflux disease)   Essential hypertension   Plan: Plan for Procedure(s): LEFT KNEE ARTHROPLASTY, CONDYLE AND PLATEAU, MEDIAL COMPARTMENT  The patient history, physical exam, clinical judgement of the provider and imaging are consistent with end stage degenerative joint disease and unicompartmental joint arthroplasty is deemed medically necessary. The treatment options including medical management, injection therapy, and arthroplasty were discussed at length. The risks and benefits of Procedure(s): LEFT KNEE ARTHROPLASTY, CONDYLE AND PLATEAU, MEDIAL COMPARTMENT were presented and reviewed.  The risks of nonoperative treatment, versus surgical intervention including but not limited to continued pain, aseptic loosening, stiffness, dislocation/subluxation, infection, bleeding, nerve injury, blood clots, cardiopulmonary complications, morbidity, mortality, among others were discussed. The patient verbalizes understanding and wishes to proceed with the plan.  Patient is being admitted for inpatient treatment for surgery, pain control, PT, OT, prophylactic antibiotics, VTE prophylaxis, progressive ambulation, ADL's and discharge planning.   Dental prophylaxis discussed and recommended for 2 years postoperatively.   The patient does meet the criteria for TXA which will be used perioperatively via IV.    ASA 325 mg will be used postoperatively for DVT prophylaxis in addition to SCDs, and early ambulation.  The patient is planning to be discharged home with OPPT in care of his wife.  Curtis Elizabeth Martensen III, PA-C 12/05/2016 1:12 PM

## 2016-12-07 DIAGNOSIS — M25632 Stiffness of left wrist, not elsewhere classified: Secondary | ICD-10-CM | POA: Diagnosis not present

## 2016-12-07 DIAGNOSIS — G5603 Carpal tunnel syndrome, bilateral upper limbs: Secondary | ICD-10-CM | POA: Diagnosis not present

## 2016-12-07 DIAGNOSIS — M25631 Stiffness of right wrist, not elsewhere classified: Secondary | ICD-10-CM | POA: Diagnosis not present

## 2016-12-07 DIAGNOSIS — M6281 Muscle weakness (generalized): Secondary | ICD-10-CM | POA: Diagnosis not present

## 2016-12-12 DIAGNOSIS — M25631 Stiffness of right wrist, not elsewhere classified: Secondary | ICD-10-CM | POA: Diagnosis not present

## 2016-12-12 DIAGNOSIS — M6281 Muscle weakness (generalized): Secondary | ICD-10-CM | POA: Diagnosis not present

## 2016-12-12 DIAGNOSIS — M25632 Stiffness of left wrist, not elsewhere classified: Secondary | ICD-10-CM | POA: Diagnosis not present

## 2016-12-12 DIAGNOSIS — G5603 Carpal tunnel syndrome, bilateral upper limbs: Secondary | ICD-10-CM | POA: Diagnosis not present

## 2016-12-14 ENCOUNTER — Encounter (HOSPITAL_BASED_OUTPATIENT_CLINIC_OR_DEPARTMENT_OTHER)
Admission: RE | Admit: 2016-12-14 | Discharge: 2016-12-14 | Disposition: A | Discharge status: Home / Self Care | Payer: Medicare Other | Source: Ambulatory Visit | Attending: Orthopedic Surgery | Admitting: Orthopedic Surgery

## 2016-12-14 ENCOUNTER — Ambulatory Visit
Admission: RE | Admit: 2016-12-14 | Discharge: 2016-12-14 | Disposition: A | Discharge status: Home / Self Care | Payer: Medicare Other | Source: Ambulatory Visit | Attending: Orthopedic Surgery | Admitting: Orthopedic Surgery

## 2016-12-14 ENCOUNTER — Encounter (HOSPITAL_BASED_OUTPATIENT_CLINIC_OR_DEPARTMENT_OTHER): Payer: Self-pay | Admitting: *Deleted

## 2016-12-14 ENCOUNTER — Other Ambulatory Visit: Payer: Self-pay | Admitting: Orthopedic Surgery

## 2016-12-14 ENCOUNTER — Other Ambulatory Visit: Payer: Self-pay

## 2016-12-14 DIAGNOSIS — M6281 Muscle weakness (generalized): Secondary | ICD-10-CM | POA: Diagnosis not present

## 2016-12-14 DIAGNOSIS — Z01818 Encounter for other preprocedural examination: Secondary | ICD-10-CM

## 2016-12-14 DIAGNOSIS — G5603 Carpal tunnel syndrome, bilateral upper limbs: Secondary | ICD-10-CM | POA: Diagnosis not present

## 2016-12-14 DIAGNOSIS — M25632 Stiffness of left wrist, not elsewhere classified: Secondary | ICD-10-CM | POA: Diagnosis not present

## 2016-12-14 DIAGNOSIS — I10 Essential (primary) hypertension: Secondary | ICD-10-CM | POA: Insufficient documentation

## 2016-12-14 DIAGNOSIS — M25631 Stiffness of right wrist, not elsewhere classified: Secondary | ICD-10-CM | POA: Diagnosis not present

## 2016-12-14 LAB — CBC
HEMATOCRIT: 28.9 % — AB (ref 39.0–52.0)
HEMOGLOBIN: 9 g/dL — AB (ref 13.0–17.0)
MCH: 25.7 pg — ABNORMAL LOW (ref 26.0–34.0)
MCHC: 31.1 g/dL (ref 30.0–36.0)
MCV: 82.6 fL (ref 78.0–100.0)
Platelets: 380 10*3/uL (ref 150–400)
RBC: 3.5 MIL/uL — AB (ref 4.22–5.81)
RDW: 14.1 % (ref 11.5–15.5)
WBC: 10.5 10*3/uL (ref 4.0–10.5)

## 2016-12-14 LAB — SURGICAL PCR SCREEN
MRSA, PCR: NEGATIVE
Staphylococcus aureus: NEGATIVE

## 2016-12-14 LAB — BASIC METABOLIC PANEL
ANION GAP: 10 (ref 5–15)
BUN: 11 mg/dL (ref 6–20)
CALCIUM: 8.9 mg/dL (ref 8.9–10.3)
CHLORIDE: 102 mmol/L (ref 101–111)
CO2: 26 mmol/L (ref 22–32)
Creatinine, Ser: 1 mg/dL (ref 0.61–1.24)
GFR calc non Af Amer: >60 mL/min (ref >60)
Glucose, Bld: 93 mg/dL (ref 65–99)
POTASSIUM: 3.9 mmol/L (ref 3.5–5.1)
SODIUM: 138 mmol/L (ref 135–145)

## 2016-12-14 NOTE — Progress Notes (Signed)
Pt in for PAT appt, Labs, EKG and PCR screen done. PT to [place] for Knee xray. PCR teaching completed.

## 2016-12-14 NOTE — Progress Notes (Signed)
CBC results called to H. Terrilyn Saver Dr. Darene Lamer. Murphy;s PA.  No orders given.

## 2016-12-19 DIAGNOSIS — G5603 Carpal tunnel syndrome, bilateral upper limbs: Secondary | ICD-10-CM | POA: Diagnosis not present

## 2016-12-19 DIAGNOSIS — M6281 Muscle weakness (generalized): Secondary | ICD-10-CM | POA: Diagnosis not present

## 2016-12-19 DIAGNOSIS — M25632 Stiffness of left wrist, not elsewhere classified: Secondary | ICD-10-CM | POA: Diagnosis not present

## 2016-12-19 DIAGNOSIS — M25631 Stiffness of right wrist, not elsewhere classified: Secondary | ICD-10-CM | POA: Diagnosis not present

## 2016-12-23 ENCOUNTER — Other Ambulatory Visit (HOSPITAL_BASED_OUTPATIENT_CLINIC_OR_DEPARTMENT_OTHER): Payer: Medicare Other

## 2016-12-23 ENCOUNTER — Ambulatory Visit: Payer: Medicare Other

## 2016-12-23 ENCOUNTER — Ambulatory Visit (HOSPITAL_BASED_OUTPATIENT_CLINIC_OR_DEPARTMENT_OTHER): Admit: 2016-12-23 | Payer: Medicare Other | Admitting: Orthopedic Surgery

## 2016-12-23 ENCOUNTER — Ambulatory Visit (HOSPITAL_BASED_OUTPATIENT_CLINIC_OR_DEPARTMENT_OTHER): Payer: Medicare Other | Admitting: Hematology & Oncology

## 2016-12-23 VITALS — BP 122/61 | HR 88 | Temp 98.2°F | Resp 18 | Wt 192.8 lb

## 2016-12-23 DIAGNOSIS — D649 Anemia, unspecified: Secondary | ICD-10-CM

## 2016-12-23 HISTORY — DX: Other complications of anesthesia, initial encounter: T88.59XA

## 2016-12-23 HISTORY — DX: Unspecified osteoarthritis, unspecified site: M19.90

## 2016-12-23 HISTORY — DX: Adverse effect of unspecified anesthetic, initial encounter: T41.45XA

## 2016-12-23 HISTORY — DX: Anemia, unspecified: D64.9

## 2016-12-23 LAB — CMP (CANCER CENTER ONLY)
ALBUMIN: 2.7 g/dL — AB (ref 3.3–5.5)
ALK PHOS: 60 U/L (ref 26–84)
ALT: 43 U/L (ref 10–47)
AST: 40 U/L — AB (ref 11–38)
BILIRUBIN TOTAL: 0.6 mg/dL (ref 0.20–1.60)
BUN, Bld: 15 mg/dL (ref 7–22)
CALCIUM: 9.4 mg/dL (ref 8.0–10.3)
CO2: 28 mEq/L (ref 18–33)
Chloride: 102 mEq/L (ref 98–108)
Creat: 1 mg/dl (ref 0.6–1.2)
Glucose, Bld: 117 mg/dL (ref 73–118)
POTASSIUM: 3.7 meq/L (ref 3.3–4.7)
Sodium: 136 mEq/L (ref 128–145)
TOTAL PROTEIN: 7 g/dL (ref 6.4–8.1)

## 2016-12-23 LAB — CBC WITH DIFFERENTIAL (CANCER CENTER ONLY)
BASO#: 0 10*3/uL (ref 0.0–0.2)
BASO%: 0.4 % (ref 0.0–2.0)
EOS%: 1.3 % (ref 0.0–7.0)
Eosinophils Absolute: 0.1 10*3/uL (ref 0.0–0.5)
HEMATOCRIT: 28.3 % — AB (ref 38.7–49.9)
HEMOGLOBIN: 8.9 g/dL — AB (ref 13.0–17.1)
LYMPH#: 1.7 10*3/uL (ref 0.9–3.3)
LYMPH%: 18.3 % (ref 14.0–48.0)
MCH: 26.4 pg — ABNORMAL LOW (ref 28.0–33.4)
MCHC: 31.4 g/dL — ABNORMAL LOW (ref 32.0–35.9)
MCV: 84 fL (ref 82–98)
MONO#: 0.9 10*3/uL (ref 0.1–0.9)
MONO%: 9.5 % (ref 0.0–13.0)
NEUT%: 70.5 % (ref 40.0–80.0)
NEUTROS ABS: 6.7 10*3/uL — AB (ref 1.5–6.5)
Platelets: 423 10*3/uL — ABNORMAL HIGH (ref 145–400)
RBC: 3.37 10*6/uL — ABNORMAL LOW (ref 4.20–5.70)
RDW: 14 % (ref 11.1–15.7)
WBC: 9.5 10*3/uL (ref 4.0–10.0)

## 2016-12-23 LAB — CHCC SATELLITE - SMEAR

## 2016-12-23 SURGERY — ARTHROPLASTY, KNEE, UNICOMPARTMENTAL
Anesthesia: Spinal | Site: Knee | Laterality: Left

## 2016-12-23 NOTE — Progress Notes (Signed)
Referral MD  Reason for Referral: Anemia-likely iron deficiency anemia   Chief Complaint  Patient presents with  . Follow-up  : I feel very tired. I need to have my left knee operated on.  HPI: Mr. Goldie is a very nice 76 year old white male. He comes in with his wife and daughter. The really she with Mr. Markuson is that he is going to need needed surgery for his left knee. He sees Dr. Percell Miller. Dr. Percell Miller, however, will not operate on Mr. Ekdahl because of his anemia.  Of note, Mr. Mans has donated quite a bit to the [employer]. He has A+ blood. He last donated back in December. He says that before he donates, he has to take iron pills.  He has seen gastroenterology. He has had endoscopies. Everything has been negative. He's had no obvious bleeding. A colonoscopy done in October 2014 showed some diverticulosis.  Of note he has been on Prilosec for many years because of reflux.  Iron studies done about 6 weeks ago showed a ferritin of 109 with iron saturation of only 8%. I suspect the ferritin elevation probably is from inflammatory conditions from his knee.  His anemia studies back in early March showed a wise account 8. Hemoglobin 10.3. Platelet count 299,000. MCV was 85. He had a normal white cell differential. His BUN was 15 and creatinine was 0.97.  In April, his hemoglobin was 9 with a hematocrit of 28.9. Platelet count 380,000. MCV was 83.  He's had no melena or bright red blood per rectum.  He does not smoke. He does not drink.  He's had no weight loss. He is not a vegetarian. He is not chewing ice.  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  He's had no rashes. He's had no cough. He's had no fever. He has not noted any palpable lymph glands.  Overall, his performance status is ECOG 1.   Past Medical History:  Diagnosis Date  . Anemia   . Arthritis    OA left knee  . Complication of anesthesia    "hard to get tube in"  . GERD (gastroesophageal reflux disease)    . Hypertension   :  Past Surgical History:  Procedure Laterality Date  . BACK SURGERY  2003   released pinched nerve  . BACK SURGERY  1972   40 yrs ago slipped disk  . CARPAL TUNNEL RELEASE     right  . carpal tunner release  09/2016   left  . rotator cuff surgery     right  :   Current Outpatient Prescriptions:  .  aspirin 81 MG tablet, Take 81 mg by mouth daily., Disp: , Rfl:  .  benazepril (LOTENSIN) 20 MG tablet, Take 20 mg by mouth daily., Disp: , Rfl:  .  ibuprofen (ADVIL,MOTRIN) 200 MG tablet, Take 200 mg by mouth every 6 (six) hours as needed., Disp: , Rfl:  .  Iron TABS, Take 25 mg by mouth., Disp: , Rfl:  .  omeprazole (PRILOSEC) 40 MG capsule, Take 40 mg by mouth daily., Disp: , Rfl: :  :  No Known Allergies:  Family History  Problem Relation Age of Onset  . Anemia Mother     age 81  . Other Father     pt states as far as he knows father didnt have medical problems  . Obesity Brother   . Colon cancer Neg Hx   . Pancreatic cancer Neg Hx   .  Stomach cancer Neg Hx   . Esophageal cancer Neg Hx   . Rectal cancer Neg Hx   :  Social History   Social History  . Marital status: Married    Spouse name: N/A  . Number of children: 3  . Years of education: N/A   Occupational History  . retired    Social History Main Topics  . Smoking status: Never Smoker  . Smokeless tobacco: Never Used  . Alcohol use Yes     Comment: rare  . Drug use: No  . Sexual activity: Not on file   Other Topics Concern  . Not on file   Social History Narrative  . No narrative on file  :  Pertinent items are noted in HPI.  Exam:Well-developed and well-nourished white male in no obvious distress. Vital signs show temperature of 98.2. Pulse 88. Blood pressure 122/61. Weight is 192 pounds. Head neck exam shows no ocular or oral lesions. His conjunctiva are slightly pale. There are no palpable cervical or supraclavicular lymph nodes. Lungs are clear bilaterally. Cardiac exam  regular rate and rhythm with no murmurs, rubs or bruits. Abdomen is soft. Has good bowel sounds. There is no fluid wave. There is no palpable liver or spleen tip. Back exam shows no tenderness over the spine, ribs or hips. Extremities shows some swelling with the left knee. He has some trace edema in his lower extremities. Skin exam shows no rashes, ecchymoses or petechia. He has numerous hyperpigmented lesions but none appear suspicious.    Recent Labs  12/23/16 1348  WBC 9.5  HGB 8.9*  HCT 28.3*  PLT 423*   No results for input(s): NA, K, CL, CO2, GLUCOSE, BUN, CREATININE, CALCIUM in the last 72 hours.  Blood smear review:  Mild poikilocytosis and anisocytosis. There are some hypochromic red blood cells. I see no teardrop cells. There are no nucleated red blood cells. Rouleaux formation is not seen. White cells per normal morphology maturation. There is no hypersegmented polys. Platelets are adequate in number and size.  Pathology: None     Assessment and Plan:  Mr. Popp is a 76 year old white male. He has anemia. I have to believe this is iron deficiency anemia. The fact that he is on a PPI, would make it difficult for him to absorb iron orally. The fact that he has been donating blood, would cause him to lose iron.  We will see what his iron levels are. I would have to believe that his iron levels are low. If they are, we will give him intravenous iron.  I will also see what his erythropoietin level is. It would not surprise me if his erythropoietin level is on the low side. I know that he does not have renal insufficiency. However, at his age, it is conceivable that his erythropoietin level will be on the lower side.  I do not see that he needs a bone marrow biopsy. I see nothing on his blood smear that suggest an underlying hematologic malignancy.  I think our goal is to try to get his hemoglobin above 11. I think if we can do this, then he will be able to have his knee surgery  safely.  I spent about 45 minutes with he and his family. There are all very nice. I answered all their questions. I went over his lab work with him.  We will see him back depending on his lab results.

## 2016-12-26 LAB — IRON AND TIBC
%SAT: 8 % — ABNORMAL LOW (ref 20–55)
IRON: 19 ug/dL — AB (ref 42–163)
TIBC: 223 ug/dL (ref 202–409)
UIBC: 204 ug/dL (ref 117–376)

## 2016-12-26 LAB — FERRITIN: Ferritin: 401 ng/ml — ABNORMAL HIGH (ref 22–316)

## 2016-12-26 LAB — RETICULOCYTES: RETICULOCYTE COUNT: 1 % (ref 0.6–2.6)

## 2016-12-26 LAB — ERYTHROPOIETIN: ERYTHROPOIETIN: 26.2 m[IU]/mL — AB (ref 2.6–18.5)

## 2016-12-27 LAB — PROTEIN ELECTROPHORESIS, SERUM, WITH REFLEX
A/G RATIO SPE: 0.8 (ref 0.7–1.7)
ALPHA 1: 0.4 g/dL (ref 0.0–0.4)
Albumin: 2.8 g/dL — ABNORMAL LOW (ref 2.9–4.4)
Alpha 2: 1.1 g/dL — ABNORMAL HIGH (ref 0.4–1.0)
Beta: 1.2 g/dL (ref 0.7–1.3)
GAMMA GLOBULIN: 1 g/dL (ref 0.4–1.8)
GLOBULIN, TOTAL: 3.7 g/dL (ref 2.2–3.9)
Total Protein: 6.5 g/dL (ref 6.0–8.5)

## 2016-12-29 ENCOUNTER — Other Ambulatory Visit: Payer: Self-pay | Admitting: Family

## 2016-12-29 ENCOUNTER — Ambulatory Visit (HOSPITAL_BASED_OUTPATIENT_CLINIC_OR_DEPARTMENT_OTHER): Payer: Medicare Other

## 2016-12-29 VITALS — BP 128/68 | HR 76 | Temp 97.9°F | Resp 16

## 2016-12-29 DIAGNOSIS — D509 Iron deficiency anemia, unspecified: Secondary | ICD-10-CM | POA: Diagnosis not present

## 2016-12-29 DIAGNOSIS — D500 Iron deficiency anemia secondary to blood loss (chronic): Secondary | ICD-10-CM

## 2016-12-29 MED ORDER — SODIUM CHLORIDE 0.9 % IV SOLN
510.0000 mg | Freq: Once | INTRAVENOUS | Status: AC
  Administered 2016-12-29: 510 mg via INTRAVENOUS
  Filled 2016-12-29: qty 17

## 2016-12-29 MED ORDER — SODIUM CHLORIDE 0.9 % IV SOLN
Freq: Once | INTRAVENOUS | Status: AC
  Administered 2016-12-29: 11:00:00 via INTRAVENOUS

## 2016-12-29 NOTE — Patient Instructions (Signed)

## 2017-01-06 ENCOUNTER — Ambulatory Visit (HOSPITAL_BASED_OUTPATIENT_CLINIC_OR_DEPARTMENT_OTHER): Payer: Medicare Other

## 2017-01-06 VITALS — BP 107/58 | HR 70 | Temp 98.0°F

## 2017-01-06 DIAGNOSIS — D500 Iron deficiency anemia secondary to blood loss (chronic): Secondary | ICD-10-CM

## 2017-01-06 DIAGNOSIS — D509 Iron deficiency anemia, unspecified: Secondary | ICD-10-CM | POA: Diagnosis not present

## 2017-01-06 MED ORDER — SODIUM CHLORIDE 0.9% FLUSH
3.0000 mL | Freq: Once | INTRAVENOUS | Status: DC | PRN
  Filled 2017-01-06: qty 10

## 2017-01-06 MED ORDER — HEPARIN SOD (PORK) LOCK FLUSH 100 UNIT/ML IV SOLN
250.0000 [IU] | Freq: Once | INTRAVENOUS | Status: DC | PRN
  Filled 2017-01-06: qty 5

## 2017-01-06 MED ORDER — ALTEPLASE 2 MG IJ SOLR
2.0000 mg | Freq: Once | INTRAMUSCULAR | Status: DC | PRN
  Filled 2017-01-06: qty 2

## 2017-01-06 MED ORDER — SODIUM CHLORIDE 0.9 % IV SOLN
510.0000 mg | Freq: Once | INTRAVENOUS | Status: AC
  Administered 2017-01-06: 510 mg via INTRAVENOUS
  Filled 2017-01-06: qty 17

## 2017-01-06 MED ORDER — SODIUM CHLORIDE 0.9 % IV SOLN: Freq: Once | INTRAVENOUS | Status: DC

## 2017-01-06 MED ORDER — SODIUM CHLORIDE 0.9% FLUSH
10.0000 mL | INTRAVENOUS | Status: DC | PRN
  Filled 2017-01-06: qty 10

## 2017-01-06 NOTE — Patient Instructions (Signed)

## 2017-02-02 ENCOUNTER — Other Ambulatory Visit: Payer: Self-pay | Admitting: *Deleted

## 2017-02-02 DIAGNOSIS — D649 Anemia, unspecified: Secondary | ICD-10-CM

## 2017-02-03 ENCOUNTER — Other Ambulatory Visit (HOSPITAL_BASED_OUTPATIENT_CLINIC_OR_DEPARTMENT_OTHER): Payer: Medicare Other

## 2017-02-03 ENCOUNTER — Encounter: Payer: Self-pay | Admitting: Hematology & Oncology

## 2017-02-03 ENCOUNTER — Ambulatory Visit (HOSPITAL_BASED_OUTPATIENT_CLINIC_OR_DEPARTMENT_OTHER): Payer: Medicare Other | Admitting: Hematology & Oncology

## 2017-02-03 DIAGNOSIS — D631 Anemia in chronic kidney disease: Secondary | ICD-10-CM | POA: Diagnosis not present

## 2017-02-03 DIAGNOSIS — N189 Chronic kidney disease, unspecified: Secondary | ICD-10-CM

## 2017-02-03 DIAGNOSIS — N183 Chronic kidney disease, stage 3 (moderate): Secondary | ICD-10-CM | POA: Diagnosis not present

## 2017-02-03 DIAGNOSIS — D649 Anemia, unspecified: Secondary | ICD-10-CM

## 2017-02-03 HISTORY — DX: Anemia in chronic kidney disease: D63.1

## 2017-02-03 LAB — COMPREHENSIVE METABOLIC PANEL
ALT: 13 U/L (ref 0–55)
AST: 18 U/L (ref 5–34)
Albumin: 3.1 g/dL — ABNORMAL LOW (ref 3.5–5.0)
Alkaline Phosphatase: 57 U/L (ref 40–150)
Anion Gap: 10 mEq/L (ref 3–11)
BUN: 12.3 mg/dL (ref 7.0–26.0)
CHLORIDE: 106 meq/L (ref 98–109)
CO2: 26 mEq/L (ref 22–29)
Calcium: 10 mg/dL (ref 8.4–10.4)
Creatinine: 1 mg/dL (ref 0.7–1.3)
EGFR: 74 mL/min/{1.73_m2} — AB (ref >90)
GLUCOSE: 90 mg/dL (ref 70–140)
POTASSIUM: 4.5 meq/L (ref 3.5–5.1)
Sodium: 143 mEq/L (ref 136–145)
Total Bilirubin: 0.39 mg/dL (ref 0.20–1.20)
Total Protein: 7.4 g/dL (ref 6.4–8.3)

## 2017-02-03 LAB — CBC WITH DIFFERENTIAL (CANCER CENTER ONLY)
BASO#: 0 10*3/uL (ref 0.0–0.2)
BASO%: 0.6 % (ref 0.0–2.0)
EOS%: 2.9 % (ref 0.0–7.0)
Eosinophils Absolute: 0.2 10*3/uL (ref 0.0–0.5)
HCT: 32.8 % — ABNORMAL LOW (ref 38.7–49.9)
HGB: 10.4 g/dL — ABNORMAL LOW (ref 13.0–17.1)
LYMPH#: 1.8 10*3/uL (ref 0.9–3.3)
LYMPH%: 25.4 % (ref 14.0–48.0)
MCH: 27.3 pg — ABNORMAL LOW (ref 28.0–33.4)
MCHC: 31.7 g/dL — AB (ref 32.0–35.9)
MCV: 86 fL (ref 82–98)
MONO#: 0.7 10*3/uL (ref 0.1–0.9)
MONO%: 9.7 % (ref 0.0–13.0)
NEUT%: 61.4 % (ref 40.0–80.0)
NEUTROS ABS: 4.4 10*3/uL (ref 1.5–6.5)
Platelets: 337 10*3/uL (ref 145–400)
RBC: 3.81 10*6/uL — ABNORMAL LOW (ref 4.20–5.70)
RDW: 16.2 % — AB (ref 11.1–15.7)
WBC: 7.2 10*3/uL (ref 4.0–10.0)

## 2017-02-03 LAB — IRON AND TIBC
%SAT: 27 % (ref 20–55)
IRON: 63 ug/dL (ref 42–163)
TIBC: 236 ug/dL (ref 202–409)
UIBC: 173 ug/dL (ref 117–376)

## 2017-02-03 LAB — FERRITIN: FERRITIN: 840 ng/mL — AB (ref 22–316)

## 2017-02-03 NOTE — Progress Notes (Signed)
Hematology and Oncology Follow Up Visit  Curtis Munoz [id number] May 04, 1941 76 y.o. 02/03/2017   Principle Diagnosis:   Anemia secondary to iron deficiency and erythropoietin deficiency  Current Therapy:    IV iron with Lavonia Dana 12/2016  Aranesp 30 g subcutaneous as needed for hemoglobin less than 11     Interim History:  Curtis Munoz is back for follow-up. This is his second office visit. We first saw him back in May. At that time, he was quite iron deficient area in his ferritin was 401 with an iron saturation of only 80%. I think a ferritin elevation is much more so from inflammatory issues. He had a low erythropoietin level of only 26. His corrected reticulocyte count was about 0.5. There was no monoclonal spike in his serum.  He feels a little better. He still has some fatigue.  He's had no bleeding. He's had no change in bowel or bladder habits.  There's been no nausea or vomiting. His appetite is doing okay.  Overall, his performance status is ECOG 1.  Medications:  Current Outpatient Prescriptions:  .  aspirin 81 MG tablet, Take 81 mg by mouth daily., Disp: , Rfl:  .  benazepril (LOTENSIN) 20 MG tablet, Take 20 mg by mouth daily., Disp: , Rfl:  .  ibuprofen (ADVIL,MOTRIN) 200 MG tablet, Take 200 mg by mouth every 6 (six) hours as needed., Disp: , Rfl:  .  Iron TABS, Take 25 mg by mouth., Disp: , Rfl:   Allergies: No Known Allergies  Past Medical History, Surgical history, Social history, and Family History were reviewed and updated.  Review of Systems: As above  Physical Exam:  weight is 177 lb (80.3 kg). His oral temperature is 98.1 F (36.7 C). His blood pressure is 109/64 and his pulse is 65. His respiration is 16 and oxygen saturation is 98%.   Wt Readings from Last 3 Encounters:  02/03/17 177 lb (80.3 kg)  12/23/16 192 lb 12.8 oz (87.5 kg)  11/01/16 198 lb 2 oz (89.9 kg)      Head neck exam shows no ocular or oral lesions. His conjunctiva are  slightly pale. There are no palpable cervical or supraclavicular lymph nodes. Lungs are clear bilaterally. Cardiac exam regular rate and rhythm with no murmurs, rubs or bruits. Abdomen is soft. Has good bowel sounds. There is no fluid wave. There is no palpable liver or spleen tip. Back exam shows no tenderness over the spine, ribs or hips. Extremities shows some swelling with the left knee. He has some trace edema in his lower extremities. Skin exam shows no rashes, ecchymoses or petechia. He has numerous hyperpigmented lesions but none appear suspicious.  Lab Results  Component Value Date   WBC 7.2 02/03/2017   HGB 10.4 (L) 02/03/2017   HCT 32.8 (L) 02/03/2017   MCV 86 02/03/2017   PLT 337 02/03/2017     Chemistry      Component Value Date/Time   NA 136 12/23/2016 1348   K 3.7 12/23/2016 1348   CL 102 12/23/2016 1348   CO2 28 12/23/2016 1348   BUN 15 12/23/2016 1348   CREATININE 1.0 12/23/2016 1348      Component Value Date/Time   CALCIUM 9.4 12/23/2016 1348   ALKPHOS 60 12/23/2016 1348   AST 40 (H) 12/23/2016 1348   ALT 43 12/23/2016 1348   BILITOT 0.60 12/23/2016 1348         Impression and Plan: Curtis Munoz is a 76 year old white male. He has  multifactorial anemia.  He is responding to iron. I think we may have to give him Aranesp. I talked to he and his wife about Aranesp. I don't see any reason why we could not give Aranesp. I think this will help further get his blood count higher.  We will have to get the Aranesp all approved. Hopefully we can get this started next week.  We will see what his iron levels show.  I will like to get him back in one month. Hopefully, his hemoglobin will be above 11 at that time.  I do not see any that we have to do a bone marrow biopsy on him.   Volanda Napoleon, MD 6/15/20188:58 AM

## 2017-02-09 ENCOUNTER — Ambulatory Visit (HOSPITAL_BASED_OUTPATIENT_CLINIC_OR_DEPARTMENT_OTHER): Payer: Medicare Other

## 2017-02-09 VITALS — BP 113/57 | HR 85 | Temp 97.6°F

## 2017-02-09 DIAGNOSIS — D631 Anemia in chronic kidney disease: Secondary | ICD-10-CM | POA: Diagnosis not present

## 2017-02-09 DIAGNOSIS — N183 Chronic kidney disease, stage 3 (moderate): Secondary | ICD-10-CM | POA: Diagnosis not present

## 2017-02-09 DIAGNOSIS — D500 Iron deficiency anemia secondary to blood loss (chronic): Secondary | ICD-10-CM

## 2017-02-09 MED ORDER — DARBEPOETIN ALFA 300 MCG/0.6ML IJ SOSY
PREFILLED_SYRINGE | INTRAMUSCULAR | Status: AC
  Filled 2017-02-09: qty 0.6

## 2017-02-09 MED ORDER — DARBEPOETIN ALFA 300 MCG/0.6ML IJ SOSY
300.0000 ug | PREFILLED_SYRINGE | Freq: Once | INTRAMUSCULAR | Status: AC
  Administered 2017-02-09: 300 ug via SUBCUTANEOUS

## 2017-02-09 NOTE — Patient Instructions (Signed)

## 2017-03-03 ENCOUNTER — Other Ambulatory Visit (HOSPITAL_BASED_OUTPATIENT_CLINIC_OR_DEPARTMENT_OTHER): Payer: Medicare Other

## 2017-03-03 ENCOUNTER — Encounter: Payer: Self-pay | Admitting: *Deleted

## 2017-03-03 ENCOUNTER — Ambulatory Visit (HOSPITAL_BASED_OUTPATIENT_CLINIC_OR_DEPARTMENT_OTHER): Payer: Medicare Other | Admitting: Family

## 2017-03-03 ENCOUNTER — Ambulatory Visit: Payer: Medicare Other

## 2017-03-03 VITALS — BP 116/62 | HR 68 | Temp 98.3°F | Resp 18 | Wt 175.0 lb

## 2017-03-03 DIAGNOSIS — N183 Chronic kidney disease, stage 3 unspecified: Secondary | ICD-10-CM

## 2017-03-03 DIAGNOSIS — N189 Chronic kidney disease, unspecified: Secondary | ICD-10-CM

## 2017-03-03 DIAGNOSIS — D631 Anemia in chronic kidney disease: Secondary | ICD-10-CM

## 2017-03-03 DIAGNOSIS — D509 Iron deficiency anemia, unspecified: Secondary | ICD-10-CM

## 2017-03-03 DIAGNOSIS — D500 Iron deficiency anemia secondary to blood loss (chronic): Secondary | ICD-10-CM

## 2017-03-03 LAB — CBC WITH DIFFERENTIAL (CANCER CENTER ONLY)
BASO#: 0 10*3/uL (ref 0.0–0.2)
BASO%: 0.6 % (ref 0.0–2.0)
EOS%: 1.7 % (ref 0.0–7.0)
Eosinophils Absolute: 0.1 10*3/uL (ref 0.0–0.5)
HCT: 38.2 % — ABNORMAL LOW (ref 38.7–49.9)
HEMOGLOBIN: 12.2 g/dL — AB (ref 13.0–17.1)
LYMPH#: 1.6 10*3/uL (ref 0.9–3.3)
LYMPH%: 22.5 % (ref 14.0–48.0)
MCH: 28.1 pg (ref 28.0–33.4)
MCHC: 31.9 g/dL — AB (ref 32.0–35.9)
MCV: 88 fL (ref 82–98)
MONO#: 0.8 10*3/uL (ref 0.1–0.9)
MONO%: 10.5 % (ref 0.0–13.0)
NEUT%: 64.7 % (ref 40.0–80.0)
NEUTROS ABS: 4.6 10*3/uL (ref 1.5–6.5)
PLATELETS: 326 10*3/uL (ref 145–400)
RBC: 4.34 10*6/uL (ref 4.20–5.70)
RDW: 16.5 % — AB (ref 11.1–15.7)
WBC: 7.1 10*3/uL (ref 4.0–10.0)

## 2017-03-03 LAB — IRON AND TIBC
%SAT: 15 % — ABNORMAL LOW (ref 20–55)
Iron: 36 ug/dL — ABNORMAL LOW (ref 42–163)
TIBC: 243 ug/dL (ref 202–409)
UIBC: 206 ug/dL (ref 117–376)

## 2017-03-03 LAB — FERRITIN: FERRITIN: 614 ng/mL — AB (ref 22–316)

## 2017-03-03 NOTE — Progress Notes (Signed)
Hematology and Oncology Follow Up Visit  Curtis Munoz [id number] 01-23-1941 76 y.o. 03/03/2017   Principle Diagnosis:  Anemia secondary to iron deficiency and erythropoietin deficiency  Current Therapy:   IV iron with Feraheme-given 12/2016 x 2 Aranesp 30 g subcutaneous as needed for hemoglobin less than 11   Interim History:  [name] is here today for follow-up. Her received 2 doses of IV iron in May and 1 dose of Aranesp in June and has responded nicely. He is asymptomatic and has no complaits at this time.  Hgb is up to 12.2 with and  MCV of 88. Iron studies are pending.  No c/o fatigued. No episodes of bleeding, bruising or petechiae.  No fever, chills, n/v, cough, rash, dizziness, SOB, chest pain, palpitations, abdominal pain or changes in bowel or bladder habits.  No swelling, tenderness, numbness or tingling in his extremities. No c/o pain at this time.  He has maintained a good appetite and is staying well hydrated. His weight is stable.   ECOG Performance Status: 0 - Asymptomatic  Medications:  Allergies as of 03/03/2017   No Known Allergies     Medication List       Accurate as of 03/03/17 11:11 AM. Always use your most recent med list.          aspirin 81 MG tablet Take 81 mg by mouth daily.   benazepril 20 MG tablet Commonly known as:  LOTENSIN Take 20 mg by mouth daily.   ibuprofen 200 MG tablet Commonly known as:  ADVIL,MOTRIN Take 200 mg by mouth every 6 (six) hours as needed.   Iron Tabs Take 25 mg by mouth.       Allergies: No Known Allergies  Past Medical History, Surgical history, Social history, and Family History were reviewed and updated.  Review of Systems: All other 10 point review of systems is negative.   Physical Exam:  weight is 175 lb (79.4 kg). His oral temperature is 98.3 F (36.8 C). His blood pressure is 116/62 and his pulse is 68. His respiration is 18 and oxygen saturation is 100%.   Wt Readings from Last 3  Encounters:  03/03/17 175 lb (79.4 kg)  02/03/17 177 lb (80.3 kg)  12/23/16 192 lb 12.8 oz (87.5 kg)    Ocular: Sclerae unicteric, pupils equal, round and reactive to light Ear-nose-throat: Oropharynx clear, dentition fair Lymphatic: No cervical, supraclavicular or axillary adenopathy Lungs no rales or rhonchi, good excursion bilaterally Heart regular rate and rhythm, no murmur appreciated Abd soft, nontender, positive bowel sounds, no liver or spleen tip palpated on exam, no fluid wave  MSK no focal spinal tenderness, no joint edema Neuro: non-focal, well-oriented, appropriate affect Breasts: Deferred   Lab Results  Component Value Date   WBC 7.1 03/03/2017   HGB 12.2 (L) 03/03/2017   HCT 38.2 (L) 03/03/2017   MCV 88 03/03/2017   PLT 326 03/03/2017   Lab Results  Component Value Date   FERRITIN 840 (H) 02/03/2017   IRON 63 02/03/2017   TIBC 236 02/03/2017   UIBC 173 02/03/2017   IRONPCTSAT 27 02/03/2017   Lab Results  Component Value Date   RBC 4.34 03/03/2017   No results found for: Nils Pyle [facility] Lab Results  Component Value Date   IGA 221 11/01/2016   Lab Results  Component Value Date   MSPIKE Not Observed 12/23/2016     Chemistry      Component Value Date/Time   NA 143 02/03/2017 0750  K 4.5 02/03/2017 0750   CL 102 12/23/2016 1348   CO2 26 02/03/2017 0750   BUN 12.3 02/03/2017 0750   CREATININE 1.0 02/03/2017 0750      Component Value Date/Time   CALCIUM 10.0 02/03/2017 0750   ALKPHOS 57 02/03/2017 0750   AST 18 02/03/2017 0750   ALT 13 02/03/2017 0750   BILITOT 0.39 02/03/2017 0750      Impression and Plan: [name] is a very pleasant 76 yo caucasian gentleman with multifactorial anemia (iron deficiency and chronic renal insufficiency). He has responded nicely to IV iron and Aranesp and his Hgb is now up to 12.2. With an MCV of 88.  He will not need Aranesp this visit. We will see what his iron studies show and bring him  back in next week for an infusion if needed.  We will plan to see him back in 2 months for repeat lab work and follow-up.  He will contact our office with any questions or concerns. We can certainly see him sooner if need be.   Eliezer Bottom, NP 7/13/201811:11 AM

## 2017-03-04 LAB — RETICULOCYTES: RETICULOCYTE COUNT: 0.9 % (ref 0.6–2.6)

## 2017-03-13 ENCOUNTER — Other Ambulatory Visit: Payer: Self-pay

## 2017-05-05 ENCOUNTER — Other Ambulatory Visit (HOSPITAL_BASED_OUTPATIENT_CLINIC_OR_DEPARTMENT_OTHER): Payer: Medicare Other

## 2017-05-05 ENCOUNTER — Ambulatory Visit (HOSPITAL_BASED_OUTPATIENT_CLINIC_OR_DEPARTMENT_OTHER): Payer: Medicare Other | Admitting: Hematology & Oncology

## 2017-05-05 VITALS — BP 106/66 | HR 60 | Temp 97.6°F | Resp 18 | Wt 180.0 lb

## 2017-05-05 DIAGNOSIS — D500 Iron deficiency anemia secondary to blood loss (chronic): Secondary | ICD-10-CM | POA: Diagnosis not present

## 2017-05-05 DIAGNOSIS — N183 Chronic kidney disease, stage 3 unspecified: Secondary | ICD-10-CM

## 2017-05-05 DIAGNOSIS — D631 Anemia in chronic kidney disease: Secondary | ICD-10-CM

## 2017-05-05 DIAGNOSIS — D508 Other iron deficiency anemias: Secondary | ICD-10-CM

## 2017-05-05 DIAGNOSIS — D649 Anemia, unspecified: Secondary | ICD-10-CM

## 2017-05-05 LAB — IRON AND TIBC
%SAT: 20 % (ref 20–55)
IRON: 50 ug/dL (ref 42–163)
TIBC: 243 ug/dL (ref 202–409)
UIBC: 193 ug/dL (ref 117–376)

## 2017-05-05 LAB — CBC WITH DIFFERENTIAL (CANCER CENTER ONLY)
BASO#: 0 10*3/uL (ref 0.0–0.2)
BASO%: 0.6 % (ref 0.0–2.0)
EOS ABS: 0.2 10*3/uL (ref 0.0–0.5)
EOS%: 3.1 % (ref 0.0–7.0)
HCT: 36.9 % — ABNORMAL LOW (ref 38.7–49.9)
HEMOGLOBIN: 12.1 g/dL — AB (ref 13.0–17.1)
LYMPH#: 2 10*3/uL (ref 0.9–3.3)
LYMPH%: 28 % (ref 14.0–48.0)
MCH: 29.3 pg (ref 28.0–33.4)
MCHC: 32.8 g/dL (ref 32.0–35.9)
MCV: 89 fL (ref 82–98)
MONO#: 0.9 10*3/uL (ref 0.1–0.9)
MONO%: 12.5 % (ref 0.0–13.0)
NEUT#: 4 10*3/uL (ref 1.5–6.5)
NEUT%: 55.8 % (ref 40.0–80.0)
Platelets: 242 10*3/uL (ref 145–400)
RBC: 4.13 10*6/uL — ABNORMAL LOW (ref 4.20–5.70)
RDW: 13.7 % (ref 11.1–15.7)
WBC: 7.1 10*3/uL (ref 4.0–10.0)

## 2017-05-05 LAB — FERRITIN: FERRITIN: 317 ng/mL — AB (ref 22–316)
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
## 2017-05-08 DIAGNOSIS — I10 Essential (primary) hypertension: Secondary | ICD-10-CM | POA: Diagnosis not present

## 2017-05-08 DIAGNOSIS — E119 Type 2 diabetes mellitus without complications: Secondary | ICD-10-CM | POA: Diagnosis not present

## 2017-05-08 DIAGNOSIS — D649 Anemia, unspecified: Secondary | ICD-10-CM | POA: Diagnosis not present

## 2017-05-08 DIAGNOSIS — E782 Mixed hyperlipidemia: Secondary | ICD-10-CM | POA: Diagnosis not present

## 2017-05-08 LAB — RETICULOCYTES: Reticulocyte Count: 0.8 % (ref 0.6–2.6)

## 2017-07-27 ENCOUNTER — Other Ambulatory Visit: Payer: Self-pay

## 2017-07-27 ENCOUNTER — Ambulatory Visit (HOSPITAL_BASED_OUTPATIENT_CLINIC_OR_DEPARTMENT_OTHER): Payer: Medicare Other | Admitting: Hematology & Oncology

## 2017-07-27 ENCOUNTER — Encounter: Payer: Self-pay | Admitting: Hematology & Oncology

## 2017-07-27 ENCOUNTER — Other Ambulatory Visit (HOSPITAL_BASED_OUTPATIENT_CLINIC_OR_DEPARTMENT_OTHER): Payer: Medicare Other

## 2017-07-27 VITALS — BP 109/55 | HR 65 | Temp 97.8°F | Resp 16 | Wt 186.0 lb

## 2017-07-27 DIAGNOSIS — D509 Iron deficiency anemia, unspecified: Secondary | ICD-10-CM

## 2017-07-27 DIAGNOSIS — D631 Anemia in chronic kidney disease: Secondary | ICD-10-CM | POA: Diagnosis not present

## 2017-07-27 DIAGNOSIS — D508 Other iron deficiency anemias: Secondary | ICD-10-CM | POA: Diagnosis not present

## 2017-07-27 DIAGNOSIS — N183 Chronic kidney disease, stage 3 (moderate): Secondary | ICD-10-CM

## 2017-07-27 DIAGNOSIS — D500 Iron deficiency anemia secondary to blood loss (chronic): Secondary | ICD-10-CM

## 2017-07-27 DIAGNOSIS — N189 Chronic kidney disease, unspecified: Secondary | ICD-10-CM | POA: Diagnosis not present

## 2017-07-27 LAB — CBC WITH DIFFERENTIAL (CANCER CENTER ONLY)
BASO#: 0.1 10*3/uL (ref 0.0–0.2)
BASO%: 0.6 % (ref 0.0–2.0)
EOS%: 3.4 % (ref 0.0–7.0)
Eosinophils Absolute: 0.3 10*3/uL (ref 0.0–0.5)
HEMATOCRIT: 35.3 % — AB (ref 38.7–49.9)
HEMOGLOBIN: 11.6 g/dL — AB (ref 13.0–17.1)
LYMPH#: 1.8 10*3/uL (ref 0.9–3.3)
LYMPH%: 22.9 % (ref 14.0–48.0)
MCH: 29.9 pg (ref 28.0–33.4)
MCHC: 32.9 g/dL (ref 32.0–35.9)
MCV: 91 fL (ref 82–98)
MONO#: 1 10*3/uL — AB (ref 0.1–0.9)
MONO%: 12.4 % (ref 0.0–13.0)
NEUT%: 60.7 % (ref 40.0–80.0)
NEUTROS ABS: 4.8 10*3/uL (ref 1.5–6.5)
Platelets: 287 10*3/uL (ref 145–400)
RBC: 3.88 10*6/uL — AB (ref 4.20–5.70)
RDW: 13.1 % (ref 11.1–15.7)
WBC: 8 10*3/uL (ref 4.0–10.0)

## 2017-07-27 LAB — IRON AND TIBC
%SAT: 18 % — AB (ref 20–55)
Iron: 44 ug/dL (ref 42–163)
TIBC: 250 ug/dL (ref 202–409)
UIBC: 206 ug/dL (ref 117–376)

## 2017-07-27 LAB — FERRITIN: FERRITIN: 383 ng/mL — AB (ref 22–316)

## 2017-07-27 LAB — RETICULOCYTES: RETICULOCYTE COUNT: 1.3 % (ref 0.6–2.6)

## 2017-07-27 NOTE — Progress Notes (Signed)
Hematology and Oncology Follow Up Visit  Curtis Munoz [id number] [birth year] 76 y.o. 07/27/2017   Principle Diagnosis:  Anemia secondary to iron deficiency and erythropoietin deficiency  Current Therapy:   IV iron with Feraheme-given 12/2016 x 2 Aranesp 300 g subcutaneous as needed for hemoglobin less than 11   Interim History:  Curtis Munoz is here today for follow-up. He is doing quite well.  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  He enjoys doing this.  He may go back up to [place] tomorrow.  Overall, he is done well.  His blood is responded very nicely to iron and Aranesp.  Back in September, his iron studies showed an iron saturation of 20%.  His ferritin was 317.  Has had no bleeding.  He has had no change in bowel or bladder habits.  He has had no cough.  He said no fever.  He has had no nausea or vomiting.  He had a very nice Thanksgiving.  Overall, his performance status is ECOG 0.    Medications:  Allergies as of 07/27/2017   No Known Allergies     Medication List        Accurate as of 07/27/17  8:19 AM. Always use your most recent med list.          aspirin 81 MG tablet Take 81 mg by mouth daily.   benazepril 20 MG tablet Commonly known as:  LOTENSIN Take 20 mg by mouth daily.   ibuprofen 200 MG tablet Commonly known as:  ADVIL,MOTRIN Take 200 mg by mouth every 6 (six) hours as needed.   Iron Tabs Take 25 mg by mouth.       Allergies: No Known Allergies  Past Medical History, Surgical history, Social history, and Family History were reviewed and updated.  Review of Systems: As stated in the interim history  Physical Exam:  weight is 186 lb (84.4 kg). His oral temperature is 97.8 F (36.6 C). His blood pressure is 109/55 (abnormal) and his pulse is 65. His respiration is 16 and oxygen saturation is 100%.   Wt Readings from Last 3 Encounters:    07/27/17 186 lb (84.4 kg)  05/05/17 180 lb (81.6 kg)  03/03/17 175 lb (79.4 kg)    Physical Exam  Constitutional: He is oriented to person, place, and time.  HENT:  Head: Normocephalic and atraumatic.  Mouth/Throat: Oropharynx is clear and moist.  Eyes: EOM are normal. Pupils are equal, round, and reactive to light.  Neck: Normal range of motion.  Cardiovascular: Normal rate, regular rhythm and normal heart sounds.  Pulmonary/Chest: Effort normal and breath sounds normal.  Abdominal: Soft. Bowel sounds are normal.  Musculoskeletal: Normal range of motion. He exhibits no edema, tenderness or deformity.  Lymphadenopathy:    He has no cervical adenopathy.  Neurological: He is alert and oriented to person, place, and time.  Skin: Skin is warm and dry. No rash noted. No erythema.  Psychiatric: He has a normal mood and affect. His behavior is normal. Judgment and thought content normal.  Vitals reviewed.    Lab Results  Component Value Date   WBC 8.0 07/27/2017   HGB 11.6 (L) 07/27/2017   HCT 35.3 (L) 07/27/2017   MCV 91 07/27/2017   PLT 287 07/27/2017   Lab Results  Component Value Date   FERRITIN 317 (H) 05/05/2017  IRON 50 05/05/2017   TIBC 243 05/05/2017   UIBC 193 05/05/2017   IRONPCTSAT 20 05/05/2017   Lab Results  Component Value Date   RBC 3.88 (L) 07/27/2017   No results found for: Nils Pyle [facility] Lab Results  Component Value Date   IGA 221 11/01/2016   Lab Results  Component Value Date   MSPIKE Not Observed 12/23/2016     Chemistry      Component Value Date/Time   NA 143 02/03/2017 0750   K 4.5 02/03/2017 0750   CL 102 12/23/2016 1348   CO2 26 02/03/2017 0750   BUN 12.3 02/03/2017 0750   CREATININE 1.0 02/03/2017 0750      Component Value Date/Time   CALCIUM 10.0 02/03/2017 0750   ALKPHOS 57 02/03/2017 0750   AST 18 02/03/2017 0750   ALT 13 02/03/2017 0750   BILITOT 0.39 02/03/2017 0750      Impression and Plan: Mr.  Munoz is a very pleasant 76 year old white male. He has multifactorial anemia. His hemoglobin is holding pretty stable right now.  His iron studies were borderline last time.  However, he really was not that symptomatic so we did not have to give him any iron.  We will plan to get him through the holidays and winter.  I think we can get him through the wintertime.  I would like to see him back in 3 months.      Volanda Napoleon, MD 12/6/20188:19 AM

## 2017-08-02 ENCOUNTER — Telehealth: Payer: Self-pay | Admitting: *Deleted

## 2017-08-02 NOTE — Telephone Encounter (Addendum)
Patient is aware of results. Appointment made  ----- Message from Volanda Napoleon, MD sent at 07/27/2017  4:41 PM EST ----- Call - iron is a little low!!  Need 1 dose of Feraheme!!  Please set up!!  Laurey Arrow

## 2017-08-07 ENCOUNTER — Ambulatory Visit (HOSPITAL_BASED_OUTPATIENT_CLINIC_OR_DEPARTMENT_OTHER): Payer: Medicare Other

## 2017-08-07 VITALS — BP 120/66 | HR 80 | Temp 97.8°F | Resp 16

## 2017-08-07 DIAGNOSIS — D500 Iron deficiency anemia secondary to blood loss (chronic): Secondary | ICD-10-CM

## 2017-08-07 DIAGNOSIS — D509 Iron deficiency anemia, unspecified: Secondary | ICD-10-CM

## 2017-08-07 DIAGNOSIS — Z23 Encounter for immunization: Secondary | ICD-10-CM

## 2017-08-07 MED ORDER — SODIUM CHLORIDE 0.9 % IV SOLN
510.0000 mg | Freq: Once | INTRAVENOUS | Status: AC
  Administered 2017-08-07: 510 mg via INTRAVENOUS
  Filled 2017-08-07: qty 17

## 2017-08-07 MED ORDER — INFLUENZA VAC SPLIT HIGH-DOSE 0.5 ML IM SUSY
0.5000 mL | PREFILLED_SYRINGE | Freq: Once | INTRAMUSCULAR | Status: AC
  Administered 2017-08-07: 0.5 mL via INTRAMUSCULAR

## 2017-08-07 MED ORDER — INFLUENZA VAC SPLIT HIGH-DOSE 0.5 ML IM SUSY
PREFILLED_SYRINGE | INTRAMUSCULAR | Status: AC
  Filled 2017-08-07: qty 0.5

## 2017-08-07 NOTE — Patient Instructions (Signed)
Ferumoxytol injection What is this medicine? FERUMOXYTOL is an iron complex. Iron is used to make healthy red blood cells, which carry oxygen and nutrients throughout the body. This medicine is used to treat iron deficiency anemia in people with chronic kidney disease. This medicine may be used for other purposes; ask your health care provider or pharmacist if you have questions. COMMON BRAND NAME(S): Feraheme What should I tell my health care provider before I take this medicine? They need to know if you have any of these conditions: -anemia not caused by low iron levels -high levels of iron in the blood -magnetic resonance imaging (MRI) test scheduled -an unusual or allergic reaction to iron, other medicines, foods, dyes, or preservatives -pregnant or trying to get pregnant -breast-feeding How should I use this medicine? This medicine is for injection into a vein. It is given by a health care professional in a hospital or clinic setting. Talk to your pediatrician regarding the use of this medicine in children. Special care may be needed. Overdosage: If you think you have taken too much of this medicine contact a poison control center or emergency room at once. NOTE: This medicine is only for you. Do not share this medicine with others. What if I miss a dose? It is important not to miss your dose. Call your doctor or health care professional if you are unable to keep an appointment. What may interact with this medicine? This medicine may interact with the following medications: -other iron products This list may not describe all possible interactions. Give your health care provider a list of all the medicines, herbs, non-prescription drugs, or dietary supplements you use. Also tell them if you smoke, drink alcohol, or use illegal drugs. Some items may interact with your medicine. What should I watch for while using this medicine? Visit your doctor or healthcare professional regularly. Tell  your doctor or healthcare professional if your symptoms do not start to get better or if they get worse. You may need blood work done while you are taking this medicine. You may need to follow a special diet. Talk to your doctor. Foods that contain iron include: whole grains/cereals, dried fruits, beans, or peas, leafy green vegetables, and organ meats (liver, kidney). What side effects may I notice from receiving this medicine? Side effects that you should report to your doctor or health care professional as soon as possible: -allergic reactions like skin rash, itching or hives, swelling of the face, lips, or tongue -breathing problems -changes in blood pressure -feeling faint or lightheaded, falls -fever or chills -flushing, sweating, or hot feelings -swelling of the ankles or feet Side effects that usually do not require medical attention (report to your doctor or health care professional if they continue or are bothersome): -diarrhea -headache -nausea, vomiting -stomach pain This list may not describe all possible side effects. Call your doctor for medical advice about side effects. You may report side effects to FDA at 1-800-FDA-1088. Where should I keep my medicine? This drug is given in a hospital or clinic and will not be stored at home. NOTE: This sheet is a summary. It may not cover all possible information. If you have questions about this medicine, talk to your doctor, pharmacist, or health care provider.  2018 Elsevier/Gold Standard (2015-09-10 12:41:49) Influenza Virus Vaccine injection What is this medicine? INFLUENZA VIRUS VACCINE (in floo EN zuh VAHY ruhs vak SEEN) helps to reduce the risk of getting influenza also known as the flu. The vaccine only helps  protect you against some strains of the flu. This medicine may be used for other purposes; ask your health care provider or pharmacist if you have questions. COMMON BRAND NAME(S): Afluria, Agriflu, Alfuria, FLUAD, Fluarix,  Fluarix Quadrivalent, Flublok, Flublok Quadrivalent, FLUCELVAX, Flulaval, Fluvirin, Fluzone, Fluzone High-Dose, Fluzone Intradermal What should I tell my health care provider before I take this medicine? They need to know if you have any of these conditions: -bleeding disorder like hemophilia -fever or infection -Guillain-Barre syndrome or other neurological problems -immune system problems -infection with the human immunodeficiency virus (HIV) or AIDS -low blood platelet counts -multiple sclerosis -an unusual or allergic reaction to influenza virus vaccine, latex, other medicines, foods, dyes, or preservatives. Different brands of vaccines contain different allergens. Some may contain latex or eggs. Talk to your doctor about your allergies to make sure that you get the right vaccine. -pregnant or trying to get pregnant -breast-feeding How should I use this medicine? This vaccine is for injection into a muscle or under the skin. It is given by a health care professional. A copy of Vaccine Information Statements will be given before each vaccination. Read this sheet carefully each time. The sheet may change frequently. Talk to your healthcare provider to see which vaccines are right for you. Some vaccines should not be used in all age groups. Overdosage: If you think you have taken too much of this medicine contact a poison control center or emergency room at once. NOTE: This medicine is only for you. Do not share this medicine with others. What if I miss a dose? This does not apply. What may interact with this medicine? -chemotherapy or radiation therapy -medicines that lower your immune system like etanercept, anakinra, infliximab, and adalimumab -medicines that treat or prevent blood clots like warfarin -phenytoin -steroid medicines like prednisone or cortisone -theophylline -vaccines This list may not describe all possible interactions. Give your health care provider a list of all the  medicines, herbs, non-prescription drugs, or dietary supplements you use. Also tell them if you smoke, drink alcohol, or use illegal drugs. Some items may interact with your medicine. What should I watch for while using this medicine? Report any side effects that do not go away within 3 days to your doctor or health care professional. Call your health care provider if any unusual symptoms occur within 6 weeks of receiving this vaccine. You may still catch the flu, but the illness is not usually as bad. You cannot get the flu from the vaccine. The vaccine will not protect against colds or other illnesses that may cause fever. The vaccine is needed every year. What side effects may I notice from receiving this medicine? Side effects that you should report to your doctor or health care professional as soon as possible: -allergic reactions like skin rash, itching or hives, swelling of the face, lips, or tongue Side effects that usually do not require medical attention (report to your doctor or health care professional if they continue or are bothersome): -fever -headache -muscle aches and pains -pain, tenderness, redness, or swelling at the injection site -tiredness This list may not describe all possible side effects. Call your doctor for medical advice about side effects. You may report side effects to FDA at 1-800-FDA-1088. Where should I keep my medicine? The vaccine will be given by a health care professional in a clinic, pharmacy, doctor's office, or other health care setting. You will not be given vaccine doses to store at home. NOTE: This sheet is a summary.  It may not cover all possible information. If you have questions about this medicine, talk to your doctor, pharmacist, or health care provider.  2018 Elsevier/Gold Standard (2015-02-27 10:07:28)

## 2017-09-04 DIAGNOSIS — Z9849 Cataract extraction status, unspecified eye: Secondary | ICD-10-CM | POA: Diagnosis not present

## 2017-09-04 DIAGNOSIS — H52223 Regular astigmatism, bilateral: Secondary | ICD-10-CM | POA: Diagnosis not present

## 2017-09-04 DIAGNOSIS — H5203 Hypermetropia, bilateral: Secondary | ICD-10-CM | POA: Diagnosis not present

## 2017-09-04 DIAGNOSIS — H43393 Other vitreous opacities, bilateral: Secondary | ICD-10-CM | POA: Diagnosis not present

## 2017-09-04 DIAGNOSIS — Z961 Presence of intraocular lens: Secondary | ICD-10-CM | POA: Diagnosis not present

## 2017-09-04 DIAGNOSIS — H524 Presbyopia: Secondary | ICD-10-CM | POA: Diagnosis not present

## 2017-10-11 DIAGNOSIS — L989 Disorder of the skin and subcutaneous tissue, unspecified: Secondary | ICD-10-CM | POA: Diagnosis not present

## 2017-10-11 DIAGNOSIS — R7309 Other abnormal glucose: Secondary | ICD-10-CM | POA: Diagnosis not present

## 2017-10-11 DIAGNOSIS — Z6827 Body mass index (BMI) 27.0-27.9, adult: Secondary | ICD-10-CM | POA: Diagnosis not present

## 2017-10-11 DIAGNOSIS — M130 Polyarthritis, unspecified: Secondary | ICD-10-CM | POA: Diagnosis not present

## 2017-10-11 DIAGNOSIS — E782 Mixed hyperlipidemia: Secondary | ICD-10-CM | POA: Diagnosis not present

## 2017-10-11 DIAGNOSIS — I10 Essential (primary) hypertension: Secondary | ICD-10-CM | POA: Diagnosis not present

## 2017-10-11 DIAGNOSIS — D649 Anemia, unspecified: Secondary | ICD-10-CM | POA: Diagnosis not present

## 2017-10-25 ENCOUNTER — Ambulatory Visit: Payer: Medicare Other

## 2017-10-25 ENCOUNTER — Inpatient Hospital Stay: Payer: Medicare Other | Attending: Hematology & Oncology | Admitting: Hematology & Oncology

## 2017-10-25 ENCOUNTER — Inpatient Hospital Stay: Payer: Medicare Other

## 2017-10-25 VITALS — BP 115/66 | HR 71 | Temp 98.2°F | Resp 18 | Wt 189.5 lb

## 2017-10-25 DIAGNOSIS — D509 Iron deficiency anemia, unspecified: Secondary | ICD-10-CM | POA: Diagnosis not present

## 2017-10-25 DIAGNOSIS — N182 Chronic kidney disease, stage 2 (mild): Secondary | ICD-10-CM

## 2017-10-25 DIAGNOSIS — D500 Iron deficiency anemia secondary to blood loss (chronic): Secondary | ICD-10-CM

## 2017-10-25 DIAGNOSIS — D631 Anemia in chronic kidney disease: Secondary | ICD-10-CM

## 2017-10-25 DIAGNOSIS — Z79899 Other long term (current) drug therapy: Secondary | ICD-10-CM | POA: Diagnosis not present

## 2017-10-25 LAB — CBC WITH DIFFERENTIAL (CANCER CENTER ONLY)
BASOS PCT: 1 %
Basophils Absolute: 0 10*3/uL (ref 0.0–0.1)
EOS ABS: 0.2 10*3/uL (ref 0.0–0.5)
EOS PCT: 3 %
HCT: 38.1 % — ABNORMAL LOW (ref 38.7–49.9)
Hemoglobin: 12.4 g/dL — ABNORMAL LOW (ref 13.0–17.1)
LYMPHS ABS: 2 10*3/uL (ref 0.9–3.3)
Lymphocytes Relative: 27 %
MCH: 30 pg (ref 28.0–33.4)
MCHC: 32.5 g/dL (ref 32.0–35.9)
MCV: 92.3 fL (ref 82.0–98.0)
MONO ABS: 0.8 10*3/uL (ref 0.1–0.9)
MONOS PCT: 11 %
Neutro Abs: 4.3 10*3/uL (ref 1.5–6.5)
Neutrophils Relative %: 58 %
Platelet Count: 251 10*3/uL (ref 145–400)
RBC: 4.13 MIL/uL — ABNORMAL LOW (ref 4.20–5.70)
RDW: 13 % (ref 11.1–15.7)
WBC Count: 7.4 10*3/uL (ref 4.0–10.0)

## 2017-10-25 LAB — CMP (CANCER CENTER ONLY)
ALBUMIN: 3.2 g/dL — AB (ref 3.5–5.0)
ALT: 21 U/L (ref 10–47)
AST: 24 U/L (ref 11–38)
Alkaline Phosphatase: 60 U/L (ref 26–84)
Anion gap: 7 (ref 5–15)
BILIRUBIN TOTAL: 0.6 mg/dL (ref 0.2–1.6)
BUN: 18 mg/dL (ref 7–22)
CALCIUM: 9.1 mg/dL (ref 8.0–10.3)
CHLORIDE: 107 mmol/L (ref 98–108)
CO2: 31 mmol/L (ref 18–33)
CREATININE: 1.4 mg/dL — AB (ref 0.60–1.20)
GLUCOSE: 97 mg/dL (ref 73–118)
Potassium: 4 mmol/L (ref 3.3–4.7)
SODIUM: 145 mmol/L (ref 128–145)
Total Protein: 7.5 g/dL (ref 6.4–8.1)

## 2017-10-25 LAB — RETICULOCYTES
RBC.: 4.1 MIL/uL — ABNORMAL LOW (ref 4.20–5.82)
RETIC COUNT ABSOLUTE: 45.1 10*3/uL (ref 34.8–93.9)
Retic Ct Pct: 1.1 % (ref 0.8–1.8)

## 2017-10-25 LAB — IRON AND TIBC
Iron: 84 ug/dL (ref 42–163)
Saturation Ratios: 34 % — ABNORMAL LOW (ref 42–163)
TIBC: 245 ug/dL (ref 202–409)
UIBC: 161 ug/dL

## 2017-10-25 LAB — FERRITIN: FERRITIN: 451 ng/mL — AB (ref 22–316)

## 2017-10-25 NOTE — Progress Notes (Signed)
Hematology and Oncology Follow Up Visit  Curtis Munoz [id number] 09/08/1940 77 y.o. 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  He looks great.  He feels good.  He is still working quite a bit.  He delivers cars for dealerships.  He has been to many places since we last saw him.  We did give him iron back in December.  At that time, his iron studies showed a ferritin of 383 with an iron saturation of only 18%.    He has had no bleeding.  There has been no fever.  He has had no problems with the flu.  There is no cough or shortness of breath.  He has had no change in bowel or bladder habits.  He has had no nausea or vomiting.  He has had no leg swelling.  Overall, his performance status is ECOG 0.  Medications:  Allergies as of 10/25/2017   No Known Allergies     Medication List        Accurate as of 10/25/17  8:38 AM. Always use your most recent med list.          aspirin 81 MG tablet Take 81 mg by mouth daily.   benazepril 20 MG tablet Commonly known as:  LOTENSIN Take 20 mg by mouth daily.   ibuprofen 200 MG tablet Commonly known as:  ADVIL,MOTRIN Take 200 mg by mouth every 6 (six) hours as needed.       Allergies: No Known Allergies  Past Medical History, Surgical history, Social history, and Family History were reviewed and updated.  Review of Systems: Review of Systems  Constitutional: Negative.   HENT: Negative.   Eyes: Negative.   Respiratory: Negative.   Cardiovascular: Negative.   Gastrointestinal: Negative.   Genitourinary: Negative.   Musculoskeletal: Negative.   Skin: Negative.   Neurological: Negative.   Endo/Heme/Allergies: Negative.   Psychiatric/Behavioral: Negative.     Physical Exam:  weight  is 189 lb 8 oz (86 kg). His oral temperature is 98.2 F (36.8 C). His blood pressure is 115/66 and his pulse is 71. His respiration is 18 and oxygen saturation is 100%.   Wt Readings from Last 3 Encounters:  10/25/17 189 lb 8 oz (86 kg)  07/27/17 186 lb (84.4 kg)  05/05/17 180 lb (81.6 kg)    Physical Exam  Constitutional: He is oriented to person, place, and time.  HENT:  Head: Normocephalic and atraumatic.  Mouth/Throat: Oropharynx is clear and moist.  Eyes: EOM are normal. Pupils are equal, round, and reactive to light.  Neck: Normal range of motion.  Cardiovascular: Normal rate, regular rhythm and normal heart sounds.  Pulmonary/Chest: Effort normal and breath sounds normal.  Abdominal: Soft. Bowel sounds are normal.  Musculoskeletal: Normal range of motion. He exhibits no edema, tenderness or deformity.  Lymphadenopathy:    He has no cervical adenopathy.  Neurological: He is alert and oriented to person, place, and time.  Skin: Skin is warm and dry. No rash noted. No erythema.  Psychiatric: He has a normal mood and affect. His behavior is normal. Judgment and thought content normal.  Vitals reviewed.    Lab Results  Component Value Date   WBC 7.4 10/25/2017   HGB 11.6 (L) 07/27/2017  HCT 38.1 (L) 10/25/2017   MCV 92.3 10/25/2017   PLT 251 10/25/2017   Lab Results  Component Value Date   FERRITIN 383 (H) 07/27/2017   IRON 44 07/27/2017   TIBC 250 07/27/2017   UIBC 206 07/27/2017   IRONPCTSAT 18 (L) 07/27/2017   Lab Results  Component Value Date   RBC 4.13 (L) 10/25/2017   No results found for: [facility] Lab Results  Component Value Date   IGA 221 11/01/2016   Lab Results  Component Value Date   MSPIKE Not Observed 12/23/2016     Chemistry      Component Value Date/Time   NA 145 10/25/2017 0802   NA 143 02/03/2017 0750   K 4.0 10/25/2017 0802   K 4.5 02/03/2017 0750   CL 107 10/25/2017 0802   CL 102 12/23/2016 1348    CO2 31 10/25/2017 0802   CO2 26 02/03/2017 0750   BUN 18 10/25/2017 0802   BUN 12.3 02/03/2017 0750   CREATININE 1.40 (H) 10/25/2017 0802   CREATININE 1.0 02/03/2017 0750      Component Value Date/Time   CALCIUM 9.1 10/25/2017 0802   CALCIUM 10.0 02/03/2017 0750   ALKPHOS 60 10/25/2017 0802   ALKPHOS 57 02/03/2017 0750   AST 24 10/25/2017 0802   AST 18 02/03/2017 0750   ALT 21 10/25/2017 0802   ALT 13 02/03/2017 0750   BILITOT 0.6 10/25/2017 0802   BILITOT 0.39 02/03/2017 0750      Impression and Plan: Curtis Munoz is a very pleasant 77 year old white male. He has multifactorial anemia. His hemoglobin is holding pretty stable right now.  I had believe that his iron levels will be okay.  We will get him back in 4 months now.  He is quite happy about this.  Volanda Napoleon, MD 3/6/20198:38 AM

## 2017-11-29 DIAGNOSIS — Z85828 Personal history of other malignant neoplasm of skin: Secondary | ICD-10-CM | POA: Diagnosis not present

## 2017-11-29 DIAGNOSIS — D225 Melanocytic nevi of trunk: Secondary | ICD-10-CM | POA: Diagnosis not present

## 2017-11-29 DIAGNOSIS — L308 Other specified dermatitis: Secondary | ICD-10-CM | POA: Diagnosis not present

## 2017-11-29 DIAGNOSIS — L821 Other seborrheic keratosis: Secondary | ICD-10-CM | POA: Diagnosis not present

## 2017-11-29 DIAGNOSIS — D1801 Hemangioma of skin and subcutaneous tissue: Secondary | ICD-10-CM | POA: Diagnosis not present

## 2017-11-29 DIAGNOSIS — L570 Actinic keratosis: Secondary | ICD-10-CM | POA: Diagnosis not present

## 2018-02-28 ENCOUNTER — Other Ambulatory Visit: Payer: Self-pay

## 2018-02-28 ENCOUNTER — Encounter: Payer: Self-pay | Admitting: Family

## 2018-02-28 ENCOUNTER — Inpatient Hospital Stay: Payer: Medicare Other | Attending: Family | Admitting: Family

## 2018-02-28 ENCOUNTER — Inpatient Hospital Stay: Payer: Medicare Other

## 2018-02-28 VITALS — BP 113/57 | HR 62 | Temp 97.9°F | Resp 20 | Wt 188.4 lb

## 2018-02-28 DIAGNOSIS — N182 Chronic kidney disease, stage 2 (mild): Secondary | ICD-10-CM | POA: Diagnosis not present

## 2018-02-28 DIAGNOSIS — D631 Anemia in chronic kidney disease: Secondary | ICD-10-CM | POA: Insufficient documentation

## 2018-02-28 DIAGNOSIS — D500 Iron deficiency anemia secondary to blood loss (chronic): Secondary | ICD-10-CM

## 2018-02-28 LAB — CMP (CANCER CENTER ONLY)
ALK PHOS: 61 U/L (ref 26–84)
ALT: 19 U/L (ref 10–47)
AST: 25 U/L (ref 11–38)
Albumin: 3.3 g/dL — ABNORMAL LOW (ref 3.5–5.0)
Anion gap: 11 (ref 5–15)
BILIRUBIN TOTAL: 0.6 mg/dL (ref 0.2–1.6)
BUN: 14 mg/dL (ref 7–22)
CALCIUM: 9.6 mg/dL (ref 8.0–10.3)
CHLORIDE: 103 mmol/L (ref 98–108)
CO2: 31 mmol/L (ref 18–33)
CREATININE: 1.1 mg/dL (ref 0.60–1.20)
Glucose, Bld: 110 mg/dL (ref 73–118)
Potassium: 4 mmol/L (ref 3.3–4.7)
SODIUM: 145 mmol/L (ref 128–145)
TOTAL PROTEIN: 7.2 g/dL (ref 6.4–8.1)

## 2018-02-28 LAB — CBC WITH DIFFERENTIAL (CANCER CENTER ONLY)
Basophils Absolute: 0.1 10*3/uL (ref 0.0–0.1)
Basophils Relative: 1 %
EOS ABS: 0.2 10*3/uL (ref 0.0–0.5)
EOS PCT: 3 %
HCT: 38 % — ABNORMAL LOW (ref 38.7–49.9)
Hemoglobin: 12.6 g/dL — ABNORMAL LOW (ref 13.0–17.1)
LYMPHS ABS: 2.1 10*3/uL (ref 0.9–3.3)
Lymphocytes Relative: 30 %
MCH: 30 pg (ref 28.0–33.4)
MCHC: 33.2 g/dL (ref 32.0–35.9)
MCV: 90.5 fL (ref 82.0–98.0)
MONO ABS: 0.7 10*3/uL (ref 0.1–0.9)
MONOS PCT: 10 %
Neutro Abs: 3.9 10*3/uL (ref 1.5–6.5)
Neutrophils Relative %: 56 %
PLATELETS: 278 10*3/uL (ref 145–400)
RBC: 4.2 MIL/uL (ref 4.20–5.70)
RDW: 12.7 % (ref 11.1–15.7)
WBC: 7 10*3/uL (ref 4.0–10.0)

## 2018-02-28 LAB — RETICULOCYTES
RBC.: 4.2 MIL/uL (ref 4.20–5.82)
RETIC CT PCT: 1.2 % (ref 0.8–1.8)
Retic Count, Absolute: 50.4 10*3/uL (ref 34.8–93.9)

## 2018-02-28 LAB — IRON AND TIBC
IRON: 85 ug/dL (ref 42–163)
Saturation Ratios: 30 % — ABNORMAL LOW (ref 42–163)
TIBC: 279 ug/dL (ref 202–409)
UIBC: 194 ug/dL

## 2018-02-28 LAB — FERRITIN: Ferritin: 477 ng/mL — ABNORMAL HIGH (ref 24–336)

## 2018-02-28 NOTE — Progress Notes (Signed)
Hematology and Oncology Follow Up Visit  Curtis Munoz [id number] November 21, 1940 77 y.o. 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 is here today for follow-up. He is doing well and has no complains at this time. 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  He denies fatigue. No issues with infections.  No fever, chills, n/v, cough, rash, dizziness, SOB, chest pain, palpitations, abdominal pain or changes in bowel or bladder habits.  His hands will sometimes be cold. This is intermittent. Radial pulses +3.  No swelling, tenderness, numbness or tingling in his extremities. No c/o pain at this time.  He walks some for exercise.  No lymphadenopathy noted on exam. No episodes of bleeding, no bruising or petechiae.  He has a good appetite and is staying well hydrated. His weight is stable.   ECOG Performance Status: 0 - Asymptomatic  Medications:  Allergies as of 02/28/2018   No Known Allergies     Medication List        Accurate as of 02/28/18  8:54 AM. Always use your most recent med list.          aspirin 81 MG tablet Take 81 mg by mouth daily.   benazepril 20 MG tablet Commonly known as:  LOTENSIN Take 20 mg by mouth daily.   ibuprofen 200 MG tablet Commonly known as:  ADVIL,MOTRIN Take 200 mg by mouth every 6 (six) hours as needed.       Allergies: No Known Allergies  Past Medical History, Surgical history, Social history, and Family History were reviewed and updated.  Review of Systems: All other 10 point review of systems is negative.   Physical Exam:  weight is 188 lb 6.4 oz (85.5 kg). His oral temperature is 97.9 F (36.6 C). His blood pressure is 113/57 (abnormal)  and his pulse is 62. His respiration is 20 and oxygen saturation is 100%.   Wt Readings from Last 3 Encounters:  02/28/18 188 lb 6.4 oz (85.5 kg)  10/25/17 189 lb 8 oz (86 kg)  07/27/17 186 lb (84.4 kg)    Ocular: Sclerae unicteric, pupils equal, round and reactive to light Ear-nose-throat: Oropharynx clear, dentition fair Lymphatic: No cervical, supraclavicular or axillary adenopathy Lungs no rales or rhonchi, good excursion bilaterally Heart regular rate and rhythm, no murmur appreciated Abd soft, nontender, positive bowel sounds, no liver or spleen tip palpated on exam, no fluid wave  MSK no focal spinal tenderness, no joint edema Neuro: non-focal, well-oriented, appropriate affect Breasts: Deferred   Lab Results  Component Value Date   WBC 7.0 02/28/2018   HGB 12.6 (L) 02/28/2018   HCT 38.0 (L) 02/28/2018   MCV 90.5 02/28/2018   PLT 278 02/28/2018   Lab Results  Component Value Date   FERRITIN 451 (H) 10/25/2017   IRON 84 10/25/2017   TIBC 245 10/25/2017   UIBC 161 10/25/2017   IRONPCTSAT 34 (L) 10/25/2017   Lab Results  Component Value Date   RETICCTPCT 1.1 10/25/2017   RBC 4.20 02/28/2018   No results found for: [facility] Lab Results  Component Value Date   IGA 221 11/01/2016   Lab Results  Component Value Date   MSPIKE  Not Observed 12/23/2016     Chemistry      Component Value Date/Time   NA 145 10/25/2017 0802   NA 143 02/03/2017 0750   K 4.0 10/25/2017 0802   K 4.5 02/03/2017 0750   CL 107 10/25/2017 0802   CL 102 12/23/2016 1348   CO2 31 10/25/2017 0802   CO2 26 02/03/2017 0750   BUN 18 10/25/2017 0802   BUN 12.3 02/03/2017 0750   CREATININE 1.40 (H) 10/25/2017 0802   CREATININE 1.0 02/03/2017 0750      Component Value Date/Time   CALCIUM 9.1 10/25/2017 0802   CALCIUM 10.0 02/03/2017 0750   ALKPHOS 60 10/25/2017 0802   ALKPHOS 57 02/03/2017 0750   AST 24 10/25/2017 0802   AST 18 02/03/2017 0750   ALT 21  10/25/2017 0802   ALT 13 02/03/2017 0750   BILITOT 0.6 10/25/2017 0802   BILITOT 0.39 02/03/2017 0750      Impression and Plan: [name] is a very pleasant 77 yo caucasian gentleman with both iron and erythropoietin deficiency anemias. He is asymptomatic at this time.  His Hgb today is 12.6 so no Aranesp needed this visit.  We will see what his iron studies show and bring him back in for infusion if needed.  We will plan to see him back in another 4 months for follow-up.  He will contact our office with any questions or concerns. We can certainly see him sooner if need be.   Laverna Peace, NP 7/10/20198:54 AM

## 2018-04-06 DIAGNOSIS — I10 Essential (primary) hypertension: Secondary | ICD-10-CM | POA: Diagnosis not present

## 2018-04-06 DIAGNOSIS — E782 Mixed hyperlipidemia: Secondary | ICD-10-CM | POA: Diagnosis not present

## 2018-04-06 DIAGNOSIS — M130 Polyarthritis, unspecified: Secondary | ICD-10-CM | POA: Diagnosis not present

## 2018-07-04 ENCOUNTER — Inpatient Hospital Stay: Payer: Medicare Other | Attending: Family | Admitting: Family

## 2018-07-04 ENCOUNTER — Encounter: Payer: Self-pay | Admitting: Family

## 2018-07-04 ENCOUNTER — Other Ambulatory Visit: Payer: Self-pay

## 2018-07-04 ENCOUNTER — Inpatient Hospital Stay: Payer: Medicare Other

## 2018-07-04 VITALS — BP 110/59 | HR 70 | Temp 97.5°F | Resp 20 | Wt 193.4 lb

## 2018-07-04 DIAGNOSIS — D509 Iron deficiency anemia, unspecified: Secondary | ICD-10-CM | POA: Diagnosis not present

## 2018-07-04 DIAGNOSIS — Z79899 Other long term (current) drug therapy: Secondary | ICD-10-CM | POA: Insufficient documentation

## 2018-07-04 DIAGNOSIS — N182 Chronic kidney disease, stage 2 (mild): Secondary | ICD-10-CM

## 2018-07-04 DIAGNOSIS — D539 Nutritional anemia, unspecified: Secondary | ICD-10-CM | POA: Diagnosis not present

## 2018-07-04 DIAGNOSIS — Z23 Encounter for immunization: Secondary | ICD-10-CM | POA: Insufficient documentation

## 2018-07-04 DIAGNOSIS — D631 Anemia in chronic kidney disease: Secondary | ICD-10-CM

## 2018-07-04 DIAGNOSIS — D500 Iron deficiency anemia secondary to blood loss (chronic): Secondary | ICD-10-CM

## 2018-07-04 LAB — CBC WITH DIFFERENTIAL (CANCER CENTER ONLY)
Abs Immature Granulocytes: 0.02 10*3/uL (ref 0.00–0.07)
Basophils Absolute: 0.1 10*3/uL (ref 0.0–0.1)
Basophils Relative: 1 %
EOS PCT: 2 %
Eosinophils Absolute: 0.2 10*3/uL (ref 0.0–0.5)
HEMATOCRIT: 40.9 % (ref 39.0–52.0)
HEMOGLOBIN: 13.1 g/dL (ref 13.0–17.0)
Immature Granulocytes: 0 %
LYMPHS ABS: 1.8 10*3/uL (ref 0.7–4.0)
LYMPHS PCT: 26 %
MCH: 29.6 pg (ref 26.0–34.0)
MCHC: 32 g/dL (ref 30.0–36.0)
MCV: 92.5 fL (ref 80.0–100.0)
MONO ABS: 0.6 10*3/uL (ref 0.1–1.0)
Monocytes Relative: 8 %
Neutro Abs: 4.2 10*3/uL (ref 1.7–7.7)
Neutrophils Relative %: 63 %
Platelet Count: 228 10*3/uL (ref 150–400)
RBC: 4.42 MIL/uL (ref 4.22–5.81)
RDW: 12.6 % (ref 11.5–15.5)
WBC: 6.8 10*3/uL (ref 4.0–10.5)
nRBC: 0 % (ref 0.0–0.2)

## 2018-07-04 LAB — CMP (CANCER CENTER ONLY)
ALK PHOS: 55 U/L (ref 38–126)
ALT: 15 U/L (ref 0–44)
ANION GAP: 8 (ref 5–15)
AST: 23 U/L (ref 15–41)
Albumin: 3.6 g/dL (ref 3.5–5.0)
BILIRUBIN TOTAL: 0.6 mg/dL (ref 0.3–1.2)
BUN: 13 mg/dL (ref 8–23)
CALCIUM: 9.6 mg/dL (ref 8.9–10.3)
CO2: 28 mmol/L (ref 22–32)
Chloride: 106 mmol/L (ref 98–111)
Creatinine: 1.12 mg/dL (ref 0.61–1.24)
GFR, Estimated: >60 mL/min (ref >60)
Glucose, Bld: 88 mg/dL (ref 70–99)
Potassium: 4.2 mmol/L (ref 3.5–5.1)
Sodium: 142 mmol/L (ref 135–145)
TOTAL PROTEIN: 7.2 g/dL (ref 6.5–8.1)

## 2018-07-04 LAB — IRON AND TIBC
Iron: 82 ug/dL (ref 42–163)
Saturation Ratios: 29 % (ref 20–55)
TIBC: 284 ug/dL (ref 202–409)
UIBC: 202 ug/dL (ref 117–376)

## 2018-07-04 LAB — RETICULOCYTES
Immature Retic Fract: 3.5 % (ref 2.3–15.9)
RBC.: 4.42 MIL/uL (ref 4.22–5.81)
RETIC CT PCT: 1.2 % (ref 0.4–3.1)
Retic Count, Absolute: 50.8 10*3/uL (ref 19.0–186.0)

## 2018-07-04 LAB — FERRITIN: FERRITIN: 358 ng/mL — AB (ref 24–336)

## 2018-07-04 MED ORDER — INFLUENZA VAC SPLIT QUAD 0.5 ML IM SUSY
0.5000 mL | PREFILLED_SYRINGE | Freq: Once | INTRAMUSCULAR | Status: AC
  Administered 2018-07-04: 0.5 mL via INTRAMUSCULAR

## 2018-07-04 NOTE — Patient Instructions (Signed)

## 2018-07-04 NOTE — Progress Notes (Signed)
Hematology and Oncology Follow Up Visit  Curtis Munoz [id number] Jul 02, 1941 77 y.o. 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 He is doing well and has no complaints at this time.  Hgb is stable at 13.1, MCV 92.  No episodes of bleeding. He is on aspirin and odes bruise easily.  He has had no issue with infection. No fever, chills, n/v, cough, rash, dizziness, SOB, chest pain, palpitations, abdominal pain or changes in bowel or bladder habits.  No swelling, tenderness, numbness or tingling in her extremities. No c/o pain.  No lymphadenopathy noted on exam.  He has maintained a good appetite and is staying well hydrated. Curtis weight is stable.  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   ECOG Performance Status: 1 - Symptomatic but completely ambulatory  Medications:  Allergies as of 07/04/2018   No Known Allergies     Medication List        Accurate as of 07/04/18  9:39 AM. Always use your most recent med list.          aspirin 81 MG tablet Take 81 mg by mouth daily.   benazepril 20 MG tablet Commonly known as:  LOTENSIN Take 20 mg by mouth daily.   ibuprofen 200 MG tablet Commonly known as:  ADVIL,MOTRIN Take 200 mg by mouth every 6 (six) hours as needed.       Allergies: No Known Allergies  Past Medical History, Surgical history, Social history, and Family History were reviewed and updated.  Review of Systems: All other 10 point review of systems is negative.   Physical Exam:  weight is 193 lb 7.2 oz (87.7 kg). Curtis oral temperature is 97.5 F (36.4 C) (abnormal). Curtis blood pressure is 110/59 (abnormal) and Curtis pulse is 70.  Curtis respiration is 20 and oxygen saturation is 100%.   Wt Readings from Last 3 Encounters:  07/04/18 193 lb 7.2 oz (87.7 kg)  02/28/18 188 lb 6.4 oz (85.5 kg)  10/25/17 189 lb 8 oz (86 kg)    Ocular: Sclerae unicteric, pupils equal, round and reactive to light Ear-nose-throat: Oropharynx clear, dentition fair Lymphatic: No cervical, supraclavicular or axillary adenopathy Lungs no rales or rhonchi, good excursion bilaterally Heart regular rate and rhythm, no murmur appreciated Abd soft, nontender, positive bowel sounds, no liver or spleen tip palpated on exam, no fluid wave  MSK no focal spinal tenderness, no joint edema Neuro: non-focal, well-oriented, appropriate affect Breasts: Deferred   Lab Results  Component Value Date   WBC 6.8 07/04/2018   HGB 13.1 07/04/2018   HCT 40.9 07/04/2018   MCV 92.5 07/04/2018   PLT 228 07/04/2018   Lab Results  Component Value Date   FERRITIN 477 (H) 02/28/2018   IRON 85 02/28/2018   TIBC 279 02/28/2018   UIBC 194 02/28/2018   IRONPCTSAT 30 (L) 02/28/2018   Lab Results  Component Value Date   RETICCTPCT 1.2 07/04/2018   RBC 4.42 07/04/2018   RBC 4.42 07/04/2018   No results found for: [facility] Lab Results  Component Value Date   IGA 221 11/01/2016   Lab Results  Component Value Date   MSPIKE Not Observed 12/23/2016  Chemistry      Component Value Date/Time   NA 145 02/28/2018 0837   NA 143 02/03/2017 0750   K 4.0 02/28/2018 0837   K 4.5 02/03/2017 0750   CL 103 02/28/2018 0837   CL 102 12/23/2016 1348   CO2 31 02/28/2018 0837   CO2 26 02/03/2017 0750   BUN 14 02/28/2018 0837   BUN 12.3 02/03/2017 0750   CREATININE 1.10 02/28/2018 0837   CREATININE 1.0 02/03/2017 0750      Component Value Date/Time   CALCIUM 9.6 02/28/2018 0837   CALCIUM 10.0 02/03/2017 0750   ALKPHOS 61 02/28/2018 0837   ALKPHOS 57 02/03/2017 0750   AST 25 02/28/2018 0837   AST 18 02/03/2017 0750   ALT 19  02/28/2018 0837   ALT 13 02/03/2017 0750   BILITOT 0.6 02/28/2018 0837   BILITOT 0.39 02/03/2017 0750      Impression and Plan: Curtis Munoz is a very pleasant 77 yo caucasian gentleman with both iron and erythropoietin deficiency anemias. He is doing well and has no complaints at this time.  Hgb is stable at 13.1 so no Aranesp needed this visit.  We will see what Curtis iron studies show and bring him back in for infusion if needed.  We will see him back in another 6 months.  He will contact our office with any questions or concerns. We can certainly see him sooner if need be.   Laverna Peace, NP 11/13/20199:39 AM

## 2018-07-09 ENCOUNTER — Other Ambulatory Visit: Payer: Self-pay

## 2018-09-14 DIAGNOSIS — I10 Essential (primary) hypertension: Secondary | ICD-10-CM | POA: Diagnosis not present

## 2018-09-14 DIAGNOSIS — E782 Mixed hyperlipidemia: Secondary | ICD-10-CM | POA: Diagnosis not present

## 2018-09-14 DIAGNOSIS — E118 Type 2 diabetes mellitus with unspecified complications: Secondary | ICD-10-CM | POA: Diagnosis not present

## 2018-09-14 DIAGNOSIS — D649 Anemia, unspecified: Secondary | ICD-10-CM | POA: Diagnosis not present

## 2018-10-19 DIAGNOSIS — Z0000 Encounter for general adult medical examination without abnormal findings: Secondary | ICD-10-CM | POA: Diagnosis not present

## 2018-12-12 DIAGNOSIS — L570 Actinic keratosis: Secondary | ICD-10-CM | POA: Diagnosis not present

## 2018-12-12 DIAGNOSIS — L821 Other seborrheic keratosis: Secondary | ICD-10-CM | POA: Diagnosis not present

## 2018-12-12 DIAGNOSIS — L820 Inflamed seborrheic keratosis: Secondary | ICD-10-CM | POA: Diagnosis not present

## 2018-12-12 DIAGNOSIS — Z85828 Personal history of other malignant neoplasm of skin: Secondary | ICD-10-CM | POA: Diagnosis not present

## 2018-12-12 DIAGNOSIS — D225 Melanocytic nevi of trunk: Secondary | ICD-10-CM | POA: Diagnosis not present

## 2018-12-12 DIAGNOSIS — D485 Neoplasm of uncertain behavior of skin: Secondary | ICD-10-CM | POA: Diagnosis not present

## 2019-01-02 ENCOUNTER — Telehealth: Payer: Self-pay | Admitting: Family

## 2019-01-02 ENCOUNTER — Inpatient Hospital Stay: Payer: Medicare Other | Attending: Family | Admitting: Family

## 2019-01-02 ENCOUNTER — Telehealth: Payer: Self-pay | Admitting: Hematology & Oncology

## 2019-01-02 ENCOUNTER — Inpatient Hospital Stay: Payer: Medicare Other

## 2019-01-02 ENCOUNTER — Other Ambulatory Visit: Payer: Self-pay

## 2019-01-02 ENCOUNTER — Encounter: Payer: Self-pay | Admitting: Family

## 2019-01-02 VITALS — BP 121/62 | HR 60 | Temp 98.0°F | Resp 20 | Wt 193.0 lb

## 2019-01-02 DIAGNOSIS — D631 Anemia in chronic kidney disease: Secondary | ICD-10-CM | POA: Diagnosis not present

## 2019-01-02 DIAGNOSIS — D500 Iron deficiency anemia secondary to blood loss (chronic): Secondary | ICD-10-CM

## 2019-01-02 DIAGNOSIS — E611 Iron deficiency: Secondary | ICD-10-CM | POA: Insufficient documentation

## 2019-01-02 DIAGNOSIS — N189 Chronic kidney disease, unspecified: Secondary | ICD-10-CM | POA: Insufficient documentation

## 2019-01-02 DIAGNOSIS — N182 Chronic kidney disease, stage 2 (mild): Secondary | ICD-10-CM

## 2019-01-02 LAB — CMP (CANCER CENTER ONLY)
ALT: 13 U/L (ref 0–44)
AST: 19 U/L (ref 15–41)
Albumin: 4.2 g/dL (ref 3.5–5.0)
Alkaline Phosphatase: 49 U/L (ref 38–126)
Anion gap: 7 (ref 5–15)
BUN: 15 mg/dL (ref 8–23)
CO2: 30 mmol/L (ref 22–32)
Calcium: 9.8 mg/dL (ref 8.9–10.3)
Chloride: 103 mmol/L (ref 98–111)
Creatinine: 1.27 mg/dL — ABNORMAL HIGH (ref 0.61–1.24)
GFR, Est AFR Am: >60 mL/min (ref >60)
GFR, Estimated: 54 mL/min — ABNORMAL LOW (ref >60)
Glucose, Bld: 98 mg/dL (ref 70–99)
Potassium: 4.4 mmol/L (ref 3.5–5.1)
Sodium: 140 mmol/L (ref 135–145)
Total Bilirubin: 0.5 mg/dL (ref 0.3–1.2)
Total Protein: 6.8 g/dL (ref 6.5–8.1)

## 2019-01-02 LAB — CBC WITH DIFFERENTIAL (CANCER CENTER ONLY)
Abs Immature Granulocytes: 0.01 10*3/uL (ref 0.00–0.07)
Basophils Absolute: 0.1 10*3/uL (ref 0.0–0.1)
Basophils Relative: 2 %
Eosinophils Absolute: 0.3 10*3/uL (ref 0.0–0.5)
Eosinophils Relative: 4 %
HCT: 39.8 % (ref 39.0–52.0)
Hemoglobin: 13 g/dL (ref 13.0–17.0)
Immature Granulocytes: 0 %
Lymphocytes Relative: 34 %
Lymphs Abs: 2 10*3/uL (ref 0.7–4.0)
MCH: 30 pg (ref 26.0–34.0)
MCHC: 32.7 g/dL (ref 30.0–36.0)
MCV: 91.7 fL (ref 80.0–100.0)
Monocytes Absolute: 0.6 10*3/uL (ref 0.1–1.0)
Monocytes Relative: 11 %
Neutro Abs: 3 10*3/uL (ref 1.7–7.7)
Neutrophils Relative %: 49 %
Platelet Count: 230 10*3/uL (ref 150–400)
RBC: 4.34 MIL/uL (ref 4.22–5.81)
RDW: 12.6 % (ref 11.5–15.5)
WBC Count: 5.9 10*3/uL (ref 4.0–10.5)
nRBC: 0 % (ref 0.0–0.2)

## 2019-01-02 LAB — RETICULOCYTES
Immature Retic Fract: 6 % (ref 2.3–15.9)
RBC.: 4.33 MIL/uL (ref 4.22–5.81)
Retic Count, Absolute: 52.8 10*3/uL (ref 19.0–186.0)
Retic Ct Pct: 1.2 % (ref 0.4–3.1)

## 2019-01-02 LAB — IRON AND TIBC
Iron: 87 ug/dL (ref 42–163)
Saturation Ratios: 30 % (ref 20–55)
TIBC: 289 ug/dL (ref 202–409)
UIBC: 202 ug/dL (ref 117–376)

## 2019-01-02 LAB — FERRITIN: Ferritin: 308 ng/mL (ref 24–336)

## 2019-01-02 NOTE — Progress Notes (Signed)
Hematology and Oncology Follow Up Visit  Curtis Munoz [id number] 10-15-1940 78 y.o. 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 He is doing quite well and has no complaints at this time. 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  He denies fatigue.  No episodes of bleeding, no bruising or petechiae.  No fever, chills, n/v, cough, rash, dizziness, SOB, chest pain, palpitations, abdominal pain or changes in bowel or bladder habits.  No swelling, tenderness, numbness or tingling in her extremities.  No lymphadenopathy noted on exam.  He has maintained a good appetite and is staying well hydrated. Her weight is stable.   ECOG Performance Status: 0 - Asymptomatic  Medications:  Allergies as of 01/02/2019   No Known Allergies     Medication List       Accurate as of Jan 02, 2019  8:50 AM. If you have any questions, ask your nurse or doctor.        aspirin 81 MG tablet Take 81 mg by mouth daily.   benazepril 20 MG tablet Commonly known as:  LOTENSIN Take 20 mg by mouth daily.   ibuprofen 200 MG tablet Commonly known as:  ADVIL Take 200 mg by mouth every 6 (six) hours as needed.       Allergies: No Known Allergies  Past Medical History, Surgical history, Social history, and Family History were reviewed and updated.  Review of Systems: All other 10 point review of systems is negative.   Physical Exam:  vitals were not taken for this visit.   Wt Readings from Last 3 Encounters:  07/04/18 193 lb 7.2 oz (87.7 kg)  02/28/18 188 lb 6.4 oz (85.5 kg)  10/25/17 189 lb 8 oz (86 kg)    Ocular: Sclerae unicteric, pupils equal, round and reactive to light Ear-nose-throat: Oropharynx clear,  dentition fair Lymphatic: No cervical or supraclavicular adenopathy Lungs no rales or rhonchi, good excursion bilaterally Heart regular rate and rhythm, no murmur appreciated Abd soft, nontender, positive bowel sounds, no liver or spleen tip palpated on exam, no fluid wave  MSK no focal spinal tenderness, no joint edema Neuro: non-focal, well-oriented, appropriate affect Breasts: Deferred   Lab Results  Component Value Date   WBC 6.8 07/04/2018   HGB 13.1 07/04/2018   HCT 40.9 07/04/2018   MCV 92.5 07/04/2018   PLT 228 07/04/2018   Lab Results  Component Value Date   FERRITIN 358 (H) 07/04/2018   IRON 82 07/04/2018   TIBC 284 07/04/2018   UIBC 202 07/04/2018   IRONPCTSAT 29 07/04/2018   Lab Results  Component Value Date   RETICCTPCT 1.2 07/04/2018   RBC 4.42 07/04/2018   RBC 4.42 07/04/2018   No results found for: Nils Pyle [facility] Lab Results  Component Value Date   IGA 221 11/01/2016   Lab Results  Component Value Date   MSPIKE Not Observed 12/23/2016     Chemistry      Component Value Date/Time   NA 142 07/04/2018 0900   NA 143 02/03/2017 0750   K 4.2 07/04/2018 0900   K 4.5 02/03/2017 0750   CL 106 07/04/2018 0900   CL 102 12/23/2016 1348   CO2 28 07/04/2018 0900   CO2 26 02/03/2017  0750   BUN 13 07/04/2018 0900   BUN 12.3 02/03/2017 0750   CREATININE 1.12 07/04/2018 0900   CREATININE 1.0 02/03/2017 0750      Component Value Date/Time   CALCIUM 9.6 07/04/2018 0900   CALCIUM 10.0 02/03/2017 0750   ALKPHOS 55 07/04/2018 0900   ALKPHOS 57 02/03/2017 0750   AST 23 07/04/2018 0900   AST 18 02/03/2017 0750   ALT 15 07/04/2018 0900   ALT 13 02/03/2017 0750   BILITOT 0.6 07/04/2018 0900   BILITOT 0.39 02/03/2017 0750       Impression and Plan: Curtis Munoz is a very pleasant 78 yo caucasian gentleman with history of both iron and erythropoietin deficiency anemias.  He has remained stable over the last couple of years and has not  required IV iron or Aranesp.  He continue to do well and has no complaints at this time.  We will plan to see him back in another year for follow-up.  He promises to contact our office with any questions or concerns. We can certainly see him sooner if need be.   Laverna Peace, NP 5/13/20208:50 AM

## 2019-01-02 NOTE — Telephone Encounter (Signed)
Called and LMVM for patient with date/time of follow up appointments per 5/13 los

## 2019-01-02 NOTE — Telephone Encounter (Signed)
Per 5/13 sch msg follow up visit has been changed to 1 year/  Appointments have been moved out to 12/2019/letter/calendar mailed

## 2019-01-15 DIAGNOSIS — D649 Anemia, unspecified: Secondary | ICD-10-CM | POA: Diagnosis not present

## 2019-01-15 DIAGNOSIS — I10 Essential (primary) hypertension: Secondary | ICD-10-CM | POA: Diagnosis not present

## 2019-01-15 DIAGNOSIS — R7309 Other abnormal glucose: Secondary | ICD-10-CM | POA: Diagnosis not present

## 2019-01-15 DIAGNOSIS — E782 Mixed hyperlipidemia: Secondary | ICD-10-CM | POA: Diagnosis not present

## 2019-05-20 DIAGNOSIS — E118 Type 2 diabetes mellitus with unspecified complications: Secondary | ICD-10-CM | POA: Diagnosis not present

## 2019-05-20 DIAGNOSIS — Z6827 Body mass index (BMI) 27.0-27.9, adult: Secondary | ICD-10-CM | POA: Diagnosis not present

## 2019-05-20 DIAGNOSIS — I10 Essential (primary) hypertension: Secondary | ICD-10-CM | POA: Diagnosis not present

## 2019-05-20 DIAGNOSIS — M130 Polyarthritis, unspecified: Secondary | ICD-10-CM | POA: Diagnosis not present

## 2019-05-20 DIAGNOSIS — E1169 Type 2 diabetes mellitus with other specified complication: Secondary | ICD-10-CM | POA: Diagnosis not present

## 2019-05-20 DIAGNOSIS — E782 Mixed hyperlipidemia: Secondary | ICD-10-CM | POA: Diagnosis not present

## 2019-06-03 DIAGNOSIS — Z23 Encounter for immunization: Secondary | ICD-10-CM | POA: Diagnosis not present

## 2019-09-04 ENCOUNTER — Other Ambulatory Visit: Payer: Medicare Other

## 2019-09-04 ENCOUNTER — Ambulatory Visit: Payer: Medicare Other

## 2019-09-04 ENCOUNTER — Ambulatory Visit: Payer: Medicare Other | Admitting: Hematology & Oncology

## 2019-09-19 DIAGNOSIS — R7309 Other abnormal glucose: Secondary | ICD-10-CM | POA: Diagnosis not present

## 2019-09-19 DIAGNOSIS — Z6828 Body mass index (BMI) 28.0-28.9, adult: Secondary | ICD-10-CM | POA: Diagnosis not present

## 2019-09-19 DIAGNOSIS — I10 Essential (primary) hypertension: Secondary | ICD-10-CM | POA: Diagnosis not present

## 2019-09-19 DIAGNOSIS — D649 Anemia, unspecified: Secondary | ICD-10-CM | POA: Diagnosis not present

## 2019-09-19 DIAGNOSIS — E782 Mixed hyperlipidemia: Secondary | ICD-10-CM | POA: Diagnosis not present

## 2019-10-20 ENCOUNTER — Ambulatory Visit: Payer: Medicare Other | Attending: Internal Medicine

## 2019-10-20 DIAGNOSIS — Z23 Encounter for immunization: Secondary | ICD-10-CM | POA: Insufficient documentation

## 2019-10-20 NOTE — Progress Notes (Signed)
   Covid-19 Vaccination Clinic  Name:  Curtis Munoz    MRN: [id number] DOB: [birth year]  10/20/2019  Mr. [name] was observed post Covid-19 immunization for 15 minutes without incidence. He was provided with Vaccine Information Sheet and instruction to access the V-Safe system.   Mr. [name] was instructed to call 911 with any severe reactions post vaccine: Marland Kitchen Difficulty breathing  . Swelling of your face and throat  . A fast heartbeat  . A bad rash all over your body  . Dizziness and weakness    Immunizations Administered    Name Date Dose VIS Date Route   Pfizer COVID-19 Vaccine 10/20/2019  2:02 PM 0.3 mL 08/02/2019 Intramuscular   Manufacturer: [place]   Lot: [id number]   [place]: [id number]

## 2019-11-19 ENCOUNTER — Ambulatory Visit: Payer: Medicare Other | Attending: Internal Medicine

## 2019-11-19 DIAGNOSIS — Z23 Encounter for immunization: Secondary | ICD-10-CM

## 2019-11-19 NOTE — Progress Notes (Signed)
   Covid-19 Vaccination Clinic  Name:  Curtis Munoz    MRN: [id number] DOB: [birth year]  11/19/2019  [name] was observed post Covid-19 immunization for 15 minutes without incident. He was provided with Vaccine Information Sheet and instruction to access the V-Safe system.   [name] was instructed to call 911 with any severe reactions post vaccine: Marland Kitchen Difficulty breathing  . Swelling of face and throat  . A fast heartbeat  . A bad rash all over body  . Dizziness and weakness   Immunizations Administered    Name Date Dose VIS Date Route   Pfizer COVID-19 Vaccine 11/19/2019  8:40 AM 0.3 mL 08/02/2019 Intramuscular   Manufacturer: [place]   Lot: [id number]   [place]: [id number]

## 2019-12-12 DIAGNOSIS — L821 Other seborrheic keratosis: Secondary | ICD-10-CM | POA: Diagnosis not present

## 2019-12-12 DIAGNOSIS — L814 Other melanin hyperpigmentation: Secondary | ICD-10-CM | POA: Diagnosis not present

## 2019-12-12 DIAGNOSIS — D0439 Carcinoma in situ of skin of other parts of face: Secondary | ICD-10-CM | POA: Diagnosis not present

## 2019-12-12 DIAGNOSIS — L570 Actinic keratosis: Secondary | ICD-10-CM | POA: Diagnosis not present

## 2019-12-12 DIAGNOSIS — Z85828 Personal history of other malignant neoplasm of skin: Secondary | ICD-10-CM | POA: Diagnosis not present

## 2020-01-01 ENCOUNTER — Other Ambulatory Visit: Payer: Self-pay

## 2020-01-01 ENCOUNTER — Inpatient Hospital Stay: Payer: Medicare Other

## 2020-01-01 ENCOUNTER — Inpatient Hospital Stay (HOSPITAL_BASED_OUTPATIENT_CLINIC_OR_DEPARTMENT_OTHER): Payer: Medicare Other | Admitting: Hematology & Oncology

## 2020-01-01 ENCOUNTER — Inpatient Hospital Stay: Payer: Medicare Other | Attending: Hematology & Oncology

## 2020-01-01 ENCOUNTER — Encounter: Payer: Self-pay | Admitting: Hematology & Oncology

## 2020-01-01 VITALS — BP 118/62 | HR 64 | Temp 96.6°F | Resp 18 | Wt 195.2 lb

## 2020-01-01 DIAGNOSIS — D631 Anemia in chronic kidney disease: Secondary | ICD-10-CM | POA: Diagnosis not present

## 2020-01-01 DIAGNOSIS — D509 Iron deficiency anemia, unspecified: Secondary | ICD-10-CM | POA: Diagnosis not present

## 2020-01-01 DIAGNOSIS — Z7982 Long term (current) use of aspirin: Secondary | ICD-10-CM | POA: Insufficient documentation

## 2020-01-01 DIAGNOSIS — D500 Iron deficiency anemia secondary to blood loss (chronic): Secondary | ICD-10-CM

## 2020-01-01 DIAGNOSIS — N183 Chronic kidney disease, stage 3 unspecified: Secondary | ICD-10-CM

## 2020-01-01 DIAGNOSIS — Z79899 Other long term (current) drug therapy: Secondary | ICD-10-CM | POA: Diagnosis not present

## 2020-01-01 LAB — CBC WITH DIFFERENTIAL (CANCER CENTER ONLY)
Abs Immature Granulocytes: 0.08 10*3/uL — ABNORMAL HIGH (ref 0.00–0.07)
Basophils Absolute: 0.1 10*3/uL (ref 0.0–0.1)
Basophils Relative: 1 %
Eosinophils Absolute: 0.3 10*3/uL (ref 0.0–0.5)
Eosinophils Relative: 5 %
HCT: 36.9 % — ABNORMAL LOW (ref 39.0–52.0)
Hemoglobin: 12.1 g/dL — ABNORMAL LOW (ref 13.0–17.0)
Immature Granulocytes: 1 %
Lymphocytes Relative: 32 %
Lymphs Abs: 2.1 10*3/uL (ref 0.7–4.0)
MCH: 30 pg (ref 26.0–34.0)
MCHC: 32.8 g/dL (ref 30.0–36.0)
MCV: 91.3 fL (ref 80.0–100.0)
Monocytes Absolute: 0.7 10*3/uL (ref 0.1–1.0)
Monocytes Relative: 11 %
Neutro Abs: 3.4 10*3/uL (ref 1.7–7.7)
Neutrophils Relative %: 50 %
Platelet Count: 190 10*3/uL (ref 150–400)
RBC: 4.04 MIL/uL — ABNORMAL LOW (ref 4.22–5.81)
RDW: 12.5 % (ref 11.5–15.5)
WBC Count: 6.7 10*3/uL (ref 4.0–10.5)
nRBC: 0 % (ref 0.0–0.2)

## 2020-01-01 LAB — CMP (CANCER CENTER ONLY)
ALT: 18 U/L (ref 0–44)
AST: 23 U/L (ref 15–41)
Albumin: 4.2 g/dL (ref 3.5–5.0)
Alkaline Phosphatase: 39 U/L (ref 38–126)
Anion gap: 7 (ref 5–15)
BUN: 17 mg/dL (ref 8–23)
CO2: 29 mmol/L (ref 22–32)
Calcium: 9.7 mg/dL (ref 8.9–10.3)
Chloride: 106 mmol/L (ref 98–111)
Creatinine: 1.24 mg/dL (ref 0.61–1.24)
GFR, Est AFR Am: >60 mL/min (ref >60)
GFR, Estimated: 55 mL/min — ABNORMAL LOW (ref >60)
Glucose, Bld: 111 mg/dL — ABNORMAL HIGH (ref 70–99)
Potassium: 4.4 mmol/L (ref 3.5–5.1)
Sodium: 142 mmol/L (ref 135–145)
Total Bilirubin: 0.6 mg/dL (ref 0.3–1.2)
Total Protein: 6.5 g/dL (ref 6.5–8.1)

## 2020-01-01 LAB — IRON AND TIBC
Iron: 83 ug/dL (ref 42–163)
Saturation Ratios: 29 % (ref 20–55)
TIBC: 285 ug/dL (ref 202–409)
UIBC: 202 ug/dL (ref 117–376)

## 2020-01-01 LAB — RETICULOCYTES
Immature Retic Fract: 7 % (ref 2.3–15.9)
RBC.: 4.05 MIL/uL — ABNORMAL LOW (ref 4.22–5.81)
Retic Count, Absolute: 56.3 10*3/uL (ref 19.0–186.0)
Retic Ct Pct: 1.4 % (ref 0.4–3.1)

## 2020-01-01 LAB — FERRITIN: Ferritin: 280 ng/mL (ref 24–336)

## 2020-01-01 NOTE — Progress Notes (Signed)
Hematology and Oncology Follow Up Visit  Curtis Munoz [id number] [birth year] 79 y.o. 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  We see him yearly.  He really is really doing quite well.  He is about to go up to [place], [place] to pick up a car.  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    He has had no problem with the coronavirus.  He did not get the coronavirus last year.  He did get his vaccines.  He did get a new great grandson back in October or November.  He has had no bleeding.  He has had no nausea or vomiting.  He has had no cough.  There has been no fever.  He has had no rashes.  There has been no leg swelling.  Overall, his performance status is ECOG 0.   Medications:  Allergies as of 01/01/2020   No Known Allergies     Medication List       Accurate as of Jan 01, 2020  9:13 AM. If you have any questions, ask your nurse or doctor.        aspirin 81 MG tablet Take 81 mg by mouth daily.   benazepril 20 MG tablet Commonly known as: LOTENSIN Take 20 mg by mouth daily.   ibuprofen 200 MG tablet Commonly known as: ADVIL Take 200 mg by mouth every 6 (six) hours as needed.   rosuvastatin 10 MG tablet Commonly known as: CRESTOR Take 10 mg by mouth at bedtime.       Allergies: No Known Allergies  Past Medical History, Surgical history, Social history, and Family History were reviewed and updated.  Review of Systems: Review of Systems  Constitutional: Negative.   HENT: Negative.   Eyes: Negative.   Respiratory: Negative.   Cardiovascular: Negative.   Gastrointestinal: Negative.   Genitourinary: Negative.   Musculoskeletal: Negative.   Skin: Negative.   Neurological: Negative.    Endo/Heme/Allergies: Negative.   Psychiatric/Behavioral: Negative.      Physical Exam:  weight is 195 lb 4 oz (88.6 kg). His temporal temperature is 96.6 F (35.9 C) (abnormal). His blood pressure is 118/62 and his pulse is 64. His respiration is 18 and oxygen saturation is 100%.   Wt Readings from Last 3 Encounters:  01/01/20 195 lb 4 oz (88.6 kg)  01/02/19 193 lb (87.5 kg)  07/04/18 193 lb 7.2 oz (87.7 kg)    Physical Exam Vitals reviewed.  HENT:     Head: Normocephalic and atraumatic.  Eyes:     Pupils: Pupils are equal, round, and reactive to light.  Cardiovascular:     Rate and Rhythm: Normal rate and regular rhythm.     Heart sounds: Normal heart sounds.  Pulmonary:     Effort: Pulmonary effort is normal.     Breath sounds: Normal breath sounds.  Abdominal:     General: Bowel sounds are normal.     Palpations: Abdomen is soft.  Musculoskeletal:        General: No tenderness or deformity. Normal range of motion.     Cervical back: Normal range of motion.  Lymphadenopathy:     Cervical: No cervical adenopathy.  Skin:    General:  Skin is warm and dry.     Findings: No erythema or rash.  Neurological:     Mental Status: He is alert and oriented to person, place, and time.  Psychiatric:        Behavior: Behavior normal.        Thought Content: Thought content normal.        Judgment: Judgment normal.      Lab Results  Component Value Date   WBC 6.7 01/01/2020   HGB 12.1 (L) 01/01/2020   HCT 36.9 (L) 01/01/2020   MCV 91.3 01/01/2020   PLT 190 01/01/2020   Lab Results  Component Value Date   FERRITIN 308 01/02/2019   IRON 87 01/02/2019   TIBC 289 01/02/2019   UIBC 202 01/02/2019   IRONPCTSAT 30 01/02/2019   Lab Results  Component Value Date   RETICCTPCT 1.4 01/01/2020   RBC 4.04 (L) 01/01/2020   RBC 4.05 (L) 01/01/2020   No results found for: [facility]ils P[facility] Lab Results  Component Value Date   IGA 221 11/01/2016   Lab  Results  Component Value Date   MSPIKE Not Observed 12/23/2016     Chemistry      Component Value Date/Time   NA 140 01/02/2019 0836   NA 143 02/03/2017 0750   K 4.4 01/02/2019 0836   K 4.5 02/03/2017 0750   CL 103 01/02/2019 0836   CL 102 12/23/2016 1348   CO2 30 01/02/2019 0836   CO2 26 02/03/2017 0750   BUN 15 01/02/2019 0836   BUN 12.3 02/03/2017 0750   CREATININE 1.27 (H) 01/02/2019 0836   CREATININE 1.0 02/03/2017 0750      Component Value Date/Time   CALCIUM 9.8 01/02/2019 0836   CALCIUM 10.0 02/03/2017 0750   ALKPHOS 49 01/02/2019 0836   ALKPHOS 57 02/03/2017 0750   AST 19 01/02/2019 0836   AST 18 02/03/2017 0750   ALT 13 01/02/2019 0836   ALT 13 02/03/2017 0750   BILITOT 0.5 01/02/2019 0836   BILITOT 0.39 02/03/2017 0750       Impression and Plan: Curtis Munoz is a very pleasant 79 yo caucasian gentleman with history of both iron and erythropoietin deficiency anemias.   His hemoglobin is dropping very slowly.  As such, we probably will still have to follow him along.  He does not need any Aranesp.  We will have to see what his iron levels look like.  We will plan to get him back in 1 year.  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   Volanda Napoleon, MD 5/12/20219:13 AM

## 2020-01-16 DIAGNOSIS — I10 Essential (primary) hypertension: Secondary | ICD-10-CM | POA: Diagnosis not present

## 2020-01-16 DIAGNOSIS — E1169 Type 2 diabetes mellitus with other specified complication: Secondary | ICD-10-CM | POA: Diagnosis not present

## 2020-01-16 DIAGNOSIS — R7309 Other abnormal glucose: Secondary | ICD-10-CM | POA: Diagnosis not present

## 2020-01-20 DIAGNOSIS — E7849 Other hyperlipidemia: Secondary | ICD-10-CM | POA: Diagnosis not present

## 2020-01-20 DIAGNOSIS — I10 Essential (primary) hypertension: Secondary | ICD-10-CM | POA: Diagnosis not present

## 2020-05-21 DIAGNOSIS — I10 Essential (primary) hypertension: Secondary | ICD-10-CM | POA: Diagnosis not present

## 2020-05-21 DIAGNOSIS — E7800 Pure hypercholesterolemia, unspecified: Secondary | ICD-10-CM | POA: Diagnosis not present

## 2020-05-21 DIAGNOSIS — E7849 Other hyperlipidemia: Secondary | ICD-10-CM | POA: Diagnosis not present

## 2020-05-21 DIAGNOSIS — Z6827 Body mass index (BMI) 27.0-27.9, adult: Secondary | ICD-10-CM | POA: Diagnosis not present

## 2020-05-21 DIAGNOSIS — R7309 Other abnormal glucose: Secondary | ICD-10-CM | POA: Diagnosis not present

## 2020-09-22 DIAGNOSIS — I10 Essential (primary) hypertension: Secondary | ICD-10-CM | POA: Diagnosis not present

## 2020-09-22 DIAGNOSIS — Z0001 Encounter for general adult medical examination with abnormal findings: Secondary | ICD-10-CM | POA: Diagnosis not present

## 2020-09-22 DIAGNOSIS — E1169 Type 2 diabetes mellitus with other specified complication: Secondary | ICD-10-CM | POA: Diagnosis not present

## 2020-09-22 DIAGNOSIS — E782 Mixed hyperlipidemia: Secondary | ICD-10-CM | POA: Diagnosis not present

## 2020-12-15 DIAGNOSIS — L821 Other seborrheic keratosis: Secondary | ICD-10-CM | POA: Diagnosis not present

## 2020-12-15 DIAGNOSIS — D225 Melanocytic nevi of trunk: Secondary | ICD-10-CM | POA: Diagnosis not present

## 2020-12-15 DIAGNOSIS — L570 Actinic keratosis: Secondary | ICD-10-CM | POA: Diagnosis not present

## 2020-12-15 DIAGNOSIS — L814 Other melanin hyperpigmentation: Secondary | ICD-10-CM | POA: Diagnosis not present

## 2020-12-15 DIAGNOSIS — Z85828 Personal history of other malignant neoplasm of skin: Secondary | ICD-10-CM | POA: Diagnosis not present

## 2020-12-19 DIAGNOSIS — E7849 Other hyperlipidemia: Secondary | ICD-10-CM | POA: Diagnosis not present

## 2020-12-19 DIAGNOSIS — I10 Essential (primary) hypertension: Secondary | ICD-10-CM | POA: Diagnosis not present

## 2020-12-31 ENCOUNTER — Inpatient Hospital Stay: Payer: Medicare Other | Admitting: Hematology & Oncology

## 2020-12-31 ENCOUNTER — Inpatient Hospital Stay: Payer: Medicare Other | Attending: Hematology & Oncology

## 2020-12-31 DIAGNOSIS — D509 Iron deficiency anemia, unspecified: Secondary | ICD-10-CM | POA: Insufficient documentation

## 2021-01-04 ENCOUNTER — Telehealth: Payer: Self-pay | Admitting: *Deleted

## 2021-01-04 NOTE — Telephone Encounter (Signed)
Called and rescheduled no-show 12/31/20 to 01/14/21 - mailed calendar - view mychart

## 2021-01-13 ENCOUNTER — Other Ambulatory Visit: Payer: Self-pay

## 2021-01-13 DIAGNOSIS — D631 Anemia in chronic kidney disease: Secondary | ICD-10-CM

## 2021-01-14 ENCOUNTER — Inpatient Hospital Stay: Payer: Medicare Other

## 2021-01-14 ENCOUNTER — Encounter: Payer: Self-pay | Admitting: Hematology & Oncology

## 2021-01-14 ENCOUNTER — Inpatient Hospital Stay (HOSPITAL_BASED_OUTPATIENT_CLINIC_OR_DEPARTMENT_OTHER): Payer: Medicare Other | Admitting: Hematology & Oncology

## 2021-01-14 ENCOUNTER — Other Ambulatory Visit: Payer: Self-pay

## 2021-01-14 VITALS — BP 134/64 | HR 68 | Temp 98.4°F | Resp 18 | Wt 190.0 lb

## 2021-01-14 DIAGNOSIS — N182 Chronic kidney disease, stage 2 (mild): Secondary | ICD-10-CM | POA: Diagnosis not present

## 2021-01-14 DIAGNOSIS — D631 Anemia in chronic kidney disease: Secondary | ICD-10-CM | POA: Diagnosis not present

## 2021-01-14 DIAGNOSIS — D509 Iron deficiency anemia, unspecified: Secondary | ICD-10-CM | POA: Diagnosis not present

## 2021-01-14 LAB — CMP (CANCER CENTER ONLY)
ALT: 13 U/L (ref 0–44)
AST: 18 U/L (ref 15–41)
Albumin: 4.1 g/dL (ref 3.5–5.0)
Alkaline Phosphatase: 36 U/L — ABNORMAL LOW (ref 38–126)
Anion gap: 6 (ref 5–15)
BUN: 18 mg/dL (ref 8–23)
CO2: 30 mmol/L (ref 22–32)
Calcium: 9.6 mg/dL (ref 8.9–10.3)
Chloride: 104 mmol/L (ref 98–111)
Creatinine: 1.18 mg/dL (ref 0.61–1.24)
GFR, Estimated: >60 mL/min (ref >60)
Glucose, Bld: 117 mg/dL — ABNORMAL HIGH (ref 70–99)
Potassium: 3.8 mmol/L (ref 3.5–5.1)
Sodium: 140 mmol/L (ref 135–145)
Total Bilirubin: 0.5 mg/dL (ref 0.3–1.2)
Total Protein: 6.5 g/dL (ref 6.5–8.1)

## 2021-01-14 LAB — CBC WITH DIFFERENTIAL (CANCER CENTER ONLY)
Abs Immature Granulocytes: 0.04 10*3/uL (ref 0.00–0.07)
Basophils Absolute: 0.1 10*3/uL (ref 0.0–0.1)
Basophils Relative: 1 %
Eosinophils Absolute: 0.2 10*3/uL (ref 0.0–0.5)
Eosinophils Relative: 3 %
HCT: 37.2 % — ABNORMAL LOW (ref 39.0–52.0)
Hemoglobin: 12.4 g/dL — ABNORMAL LOW (ref 13.0–17.0)
Immature Granulocytes: 1 %
Lymphocytes Relative: 33 %
Lymphs Abs: 1.8 10*3/uL (ref 0.7–4.0)
MCH: 29.8 pg (ref 26.0–34.0)
MCHC: 33.3 g/dL (ref 30.0–36.0)
MCV: 89.4 fL (ref 80.0–100.0)
Monocytes Absolute: 0.6 10*3/uL (ref 0.1–1.0)
Monocytes Relative: 12 %
Neutro Abs: 2.7 10*3/uL (ref 1.7–7.7)
Neutrophils Relative %: 50 %
Platelet Count: 169 10*3/uL (ref 150–400)
RBC: 4.16 MIL/uL — ABNORMAL LOW (ref 4.22–5.81)
RDW: 12.4 % (ref 11.5–15.5)
WBC Count: 5.5 10*3/uL (ref 4.0–10.5)
nRBC: 0 % (ref 0.0–0.2)

## 2021-01-14 LAB — RETICULOCYTES
Immature Retic Fract: 8.1 % (ref 2.3–15.9)
RBC.: 4.15 MIL/uL — ABNORMAL LOW (ref 4.22–5.81)
Retic Count, Absolute: 52.7 10*3/uL (ref 19.0–186.0)
Retic Ct Pct: 1.3 % (ref 0.4–3.1)

## 2021-01-14 NOTE — Progress Notes (Signed)
Hematology and Oncology Follow Up Visit  Curtis Munoz [id number] Jul 22, 1941 80 y.o. 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  This should hopefully be last time that we have to see him.  He is doing quite well.  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  He has been quite busy.  He really would like to just ease out of the job.  He has been busy driving up to [place].  He has gone down to [facility].  Again he drives quite a bit.  He has had no problems with respect to blood.  I told him that if he wanted to give a blood donation he could do this twice a year.  He has had no problems with bowels or bladder.  He has had no cough or shortness of breath.  He has had no issues with nausea or vomiting.  Overall, his performance status is ECOG 0.    Medications:  Allergies as of 01/14/2021   No Known Allergies     Medication List       Accurate as of Jan 14, 2021  3:11 PM. If you have any questions, ask your nurse or doctor.        aspirin 81 MG tablet Take 81 mg by mouth daily.   benazepril 20 MG tablet Commonly known as: LOTENSIN Take 20 mg by mouth daily.   ibuprofen 200 MG tablet Commonly known as: ADVIL Take 200 mg by mouth every 6 (six) hours as needed.   rosuvastatin 10 MG tablet Commonly known as: CRESTOR Take 10 mg by mouth at bedtime.       Allergies: No Known Allergies  Past Medical History, Surgical history, Social history, and Family History were reviewed and updated.  Review of Systems: Review of Systems  Constitutional: Negative.   HENT: Negative.   Eyes: Negative.   Respiratory: Negative.   Cardiovascular: Negative.   Gastrointestinal: Negative.   Genitourinary: Negative.   Musculoskeletal:  Negative.   Skin: Negative.   Neurological: Negative.   Endo/Heme/Allergies: Negative.   Psychiatric/Behavioral: Negative.      Physical Exam:  weight is 190 lb (86.2 kg). His oral temperature is 98.4 F (36.9 C). His blood pressure is 134/64 and his pulse is 68. His respiration is 18 and oxygen saturation is 100%.   Wt Readings from Last 3 Encounters:  01/14/21 190 lb (86.2 kg)  01/01/20 195 lb 4 oz (88.6 kg)  01/02/19 193 lb (87.5 kg)    Physical Exam Vitals reviewed.  HENT:     Head: Normocephalic and atraumatic.  Eyes:     Pupils: Pupils are equal, round, and reactive to light.  Cardiovascular:     Rate and Rhythm: Normal rate and regular rhythm.     Heart sounds: Normal heart sounds.  Pulmonary:     Effort: Pulmonary effort is normal.     Breath sounds: Normal breath sounds.  Abdominal:     General: Bowel sounds are normal.     Palpations: Abdomen is soft.  Musculoskeletal:        General: No tenderness or deformity. Normal range of motion.     Cervical back: Normal range of motion.  Lymphadenopathy:     Cervical: No cervical adenopathy.  Skin:  General: Skin is warm and dry.     Findings: No erythema or rash.  Neurological:     Mental Status: He is alert and oriented to person, place, and time.  Psychiatric:        Behavior: Behavior normal.        Thought Content: Thought content normal.        Judgment: Judgment normal.      Lab Results  Component Value Date   WBC 5.5 01/14/2021   HGB 12.4 (L) 01/14/2021   HCT 37.2 (L) 01/14/2021   MCV 89.4 01/14/2021   PLT 169 01/14/2021   Lab Results  Component Value Date   FERRITIN 280 01/01/2020   IRON 83 01/01/2020   TIBC 285 01/01/2020   UIBC 202 01/01/2020   IRONPCTSAT 29 01/01/2020   Lab Results  Component Value Date   RETICCTPCT 1.3 01/14/2021   RBC 4.16 (L) 01/14/2021   RBC 4.15 (L) 01/14/2021   No results found for: Nils Pyle [facility] Lab Results  Component Value Date    IGA 221 11/01/2016   Lab Results  Component Value Date   MSPIKE Not Observed 12/23/2016     Chemistry      Component Value Date/Time   NA 140 01/14/2021 1412   NA 143 02/03/2017 0750   K 3.8 01/14/2021 1412   K 4.5 02/03/2017 0750   CL 104 01/14/2021 1412   CL 102 12/23/2016 1348   CO2 30 01/14/2021 1412   CO2 26 02/03/2017 0750   BUN 18 01/14/2021 1412   BUN 12.3 02/03/2017 0750   CREATININE 1.18 01/14/2021 1412   CREATININE 1.0 02/03/2017 0750      Component Value Date/Time   CALCIUM 9.6 01/14/2021 1412   CALCIUM 10.0 02/03/2017 0750   ALKPHOS 36 (L) 01/14/2021 1412   ALKPHOS 57 02/03/2017 0750   AST 18 01/14/2021 1412   AST 18 02/03/2017 0750   ALT 13 01/14/2021 1412   ALT 13 02/03/2017 0750   BILITOT 0.5 01/14/2021 1412   BILITOT 0.39 02/03/2017 0750       Impression and Plan: Curtis Munoz is a very pleasant 80 yo caucasian gentleman with history of both iron and erythropoietin deficiency anemias.   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  I just do not see that we are making a difference right now with his health care.  I told him that if he ever had problems down the road, he can always let us know and we can get him back.  I still think it would be okay to allow him to donate blood to the [employer].  I think if he does this twice a year, this would be safe.   Volanda Napoleon, MD 5/26/20223:11 PM

## 2021-01-15 LAB — IRON AND TIBC
Iron: 100 ug/dL (ref 42–163)
Saturation Ratios: 34 % (ref 20–55)
TIBC: 291 ug/dL (ref 202–409)
UIBC: 192 ug/dL (ref 117–376)

## 2021-01-15 LAB — FERRITIN: Ferritin: 311 ng/mL (ref 24–336)

## 2021-01-19 ENCOUNTER — Telehealth: Payer: Self-pay

## 2021-01-19 DIAGNOSIS — I10 Essential (primary) hypertension: Secondary | ICD-10-CM | POA: Diagnosis not present

## 2021-01-19 DIAGNOSIS — E7849 Other hyperlipidemia: Secondary | ICD-10-CM | POA: Diagnosis not present

## 2021-01-19 NOTE — Telephone Encounter (Signed)
No LOS from 01/14/21   Webb Silversmith 01/19/21

## 2021-03-21 DIAGNOSIS — I10 Essential (primary) hypertension: Secondary | ICD-10-CM | POA: Diagnosis not present

## 2021-03-21 DIAGNOSIS — E7849 Other hyperlipidemia: Secondary | ICD-10-CM | POA: Diagnosis not present

## 2021-04-21 DIAGNOSIS — E7849 Other hyperlipidemia: Secondary | ICD-10-CM | POA: Diagnosis not present

## 2021-04-21 DIAGNOSIS — I10 Essential (primary) hypertension: Secondary | ICD-10-CM | POA: Diagnosis not present

## 2021-04-30 DIAGNOSIS — M130 Polyarthritis, unspecified: Secondary | ICD-10-CM | POA: Diagnosis not present

## 2021-04-30 DIAGNOSIS — E782 Mixed hyperlipidemia: Secondary | ICD-10-CM | POA: Diagnosis not present

## 2021-04-30 DIAGNOSIS — I10 Essential (primary) hypertension: Secondary | ICD-10-CM | POA: Diagnosis not present

## 2021-04-30 DIAGNOSIS — D649 Anemia, unspecified: Secondary | ICD-10-CM | POA: Diagnosis not present

## 2021-05-21 DIAGNOSIS — I10 Essential (primary) hypertension: Secondary | ICD-10-CM | POA: Diagnosis not present

## 2021-05-21 DIAGNOSIS — E7849 Other hyperlipidemia: Secondary | ICD-10-CM | POA: Diagnosis not present

## 2021-06-21 DIAGNOSIS — I10 Essential (primary) hypertension: Secondary | ICD-10-CM | POA: Diagnosis not present

## 2021-06-21 DIAGNOSIS — E7849 Other hyperlipidemia: Secondary | ICD-10-CM | POA: Diagnosis not present

## 2021-09-02 DIAGNOSIS — E1169 Type 2 diabetes mellitus with other specified complication: Secondary | ICD-10-CM | POA: Diagnosis not present

## 2021-09-02 DIAGNOSIS — I10 Essential (primary) hypertension: Secondary | ICD-10-CM | POA: Diagnosis not present

## 2021-09-02 DIAGNOSIS — E7849 Other hyperlipidemia: Secondary | ICD-10-CM | POA: Diagnosis not present

## 2021-10-07 DIAGNOSIS — H5203 Hypermetropia, bilateral: Secondary | ICD-10-CM | POA: Diagnosis not present

## 2021-10-07 DIAGNOSIS — H524 Presbyopia: Secondary | ICD-10-CM | POA: Diagnosis not present

## 2021-10-07 DIAGNOSIS — H52223 Regular astigmatism, bilateral: Secondary | ICD-10-CM | POA: Diagnosis not present

## 2021-10-07 DIAGNOSIS — H2513 Age-related nuclear cataract, bilateral: Secondary | ICD-10-CM | POA: Diagnosis not present

## 2021-12-15 DIAGNOSIS — Z85828 Personal history of other malignant neoplasm of skin: Secondary | ICD-10-CM | POA: Diagnosis not present

## 2021-12-15 DIAGNOSIS — L821 Other seborrheic keratosis: Secondary | ICD-10-CM | POA: Diagnosis not present

## 2021-12-15 DIAGNOSIS — D1801 Hemangioma of skin and subcutaneous tissue: Secondary | ICD-10-CM | POA: Diagnosis not present

## 2021-12-15 DIAGNOSIS — L814 Other melanin hyperpigmentation: Secondary | ICD-10-CM | POA: Diagnosis not present

## 2021-12-15 DIAGNOSIS — D692 Other nonthrombocytopenic purpura: Secondary | ICD-10-CM | POA: Diagnosis not present

## 2021-12-15 DIAGNOSIS — L820 Inflamed seborrheic keratosis: Secondary | ICD-10-CM | POA: Diagnosis not present

## 2021-12-15 DIAGNOSIS — L570 Actinic keratosis: Secondary | ICD-10-CM | POA: Diagnosis not present

## 2022-01-31 DIAGNOSIS — C44111 Basal cell carcinoma of skin of unspecified eyelid, including canthus: Secondary | ICD-10-CM | POA: Diagnosis not present

## 2022-01-31 DIAGNOSIS — E1169 Type 2 diabetes mellitus with other specified complication: Secondary | ICD-10-CM | POA: Diagnosis not present

## 2022-01-31 DIAGNOSIS — E7849 Other hyperlipidemia: Secondary | ICD-10-CM | POA: Diagnosis not present

## 2022-01-31 DIAGNOSIS — I10 Essential (primary) hypertension: Secondary | ICD-10-CM | POA: Diagnosis not present

## 2022-01-31 DIAGNOSIS — Z0000 Encounter for general adult medical examination without abnormal findings: Secondary | ICD-10-CM | POA: Diagnosis not present

## 2022-01-31 DIAGNOSIS — M130 Polyarthritis, unspecified: Secondary | ICD-10-CM | POA: Diagnosis not present

## 2022-07-05 DIAGNOSIS — I10 Essential (primary) hypertension: Secondary | ICD-10-CM | POA: Diagnosis not present

## 2022-07-05 DIAGNOSIS — Z6379 Other stressful life events affecting family and household: Secondary | ICD-10-CM | POA: Diagnosis not present

## 2022-07-05 DIAGNOSIS — Z636 Dependent relative needing care at home: Secondary | ICD-10-CM | POA: Diagnosis not present

## 2022-07-05 DIAGNOSIS — I129 Hypertensive chronic kidney disease with stage 1 through stage 4 chronic kidney disease, or unspecified chronic kidney disease: Secondary | ICD-10-CM | POA: Diagnosis not present

## 2022-07-05 DIAGNOSIS — N1832 Chronic kidney disease, stage 3b: Secondary | ICD-10-CM | POA: Diagnosis not present

## 2022-07-05 DIAGNOSIS — E7800 Pure hypercholesterolemia, unspecified: Secondary | ICD-10-CM | POA: Diagnosis not present

## 2022-07-05 DIAGNOSIS — D631 Anemia in chronic kidney disease: Secondary | ICD-10-CM | POA: Diagnosis not present

## 2022-07-29 DIAGNOSIS — Z23 Encounter for immunization: Secondary | ICD-10-CM | POA: Diagnosis not present

## 2022-12-06 DIAGNOSIS — Z6828 Body mass index (BMI) 28.0-28.9, adult: Secondary | ICD-10-CM | POA: Diagnosis not present

## 2022-12-06 DIAGNOSIS — E789 Disorder of lipoprotein metabolism, unspecified: Secondary | ICD-10-CM | POA: Diagnosis not present

## 2022-12-06 DIAGNOSIS — E118 Type 2 diabetes mellitus with unspecified complications: Secondary | ICD-10-CM | POA: Diagnosis not present

## 2022-12-06 DIAGNOSIS — R7309 Other abnormal glucose: Secondary | ICD-10-CM | POA: Diagnosis not present

## 2022-12-06 DIAGNOSIS — I129 Hypertensive chronic kidney disease with stage 1 through stage 4 chronic kidney disease, or unspecified chronic kidney disease: Secondary | ICD-10-CM | POA: Diagnosis not present

## 2022-12-06 DIAGNOSIS — D649 Anemia, unspecified: Secondary | ICD-10-CM | POA: Diagnosis not present

## 2022-12-06 DIAGNOSIS — M130 Polyarthritis, unspecified: Secondary | ICD-10-CM | POA: Diagnosis not present

## 2023-08-21 DIAGNOSIS — L814 Other melanin hyperpigmentation: Secondary | ICD-10-CM | POA: Diagnosis not present

## 2023-08-21 DIAGNOSIS — D225 Melanocytic nevi of trunk: Secondary | ICD-10-CM | POA: Diagnosis not present

## 2023-08-21 DIAGNOSIS — L821 Other seborrheic keratosis: Secondary | ICD-10-CM | POA: Diagnosis not present

## 2024-01-04 DIAGNOSIS — I129 Hypertensive chronic kidney disease with stage 1 through stage 4 chronic kidney disease, or unspecified chronic kidney disease: Secondary | ICD-10-CM | POA: Diagnosis not present

## 2024-01-04 DIAGNOSIS — R7309 Other abnormal glucose: Secondary | ICD-10-CM | POA: Diagnosis not present

## 2024-01-04 DIAGNOSIS — N1832 Chronic kidney disease, stage 3b: Secondary | ICD-10-CM | POA: Diagnosis not present

## 2024-01-04 DIAGNOSIS — E782 Mixed hyperlipidemia: Secondary | ICD-10-CM | POA: Diagnosis not present

## 2024-05-09 DIAGNOSIS — E7849 Other hyperlipidemia: Secondary | ICD-10-CM | POA: Diagnosis not present

## 2024-05-09 DIAGNOSIS — N1832 Chronic kidney disease, stage 3b: Secondary | ICD-10-CM | POA: Diagnosis not present

## 2024-05-09 DIAGNOSIS — I129 Hypertensive chronic kidney disease with stage 1 through stage 4 chronic kidney disease, or unspecified chronic kidney disease: Secondary | ICD-10-CM | POA: Diagnosis not present
